# Patient Record
Sex: Male | Born: 1945 | Race: Black or African American | Hispanic: No | Marital: Single | Smoking: Never smoker
Health system: Southern US, Community
[De-identification: ages and names within clinical notes are randomized; demographics above are authoritative.]

## PROBLEM LIST (undated history)

## (undated) DIAGNOSIS — M1990 Unspecified osteoarthritis, unspecified site: Secondary | ICD-10-CM

## (undated) DIAGNOSIS — F0390 Unspecified dementia without behavioral disturbance: Secondary | ICD-10-CM

## (undated) DIAGNOSIS — G40909 Epilepsy, unspecified, not intractable, without status epilepticus: Secondary | ICD-10-CM

## (undated) DIAGNOSIS — R279 Unspecified lack of coordination: Secondary | ICD-10-CM

## (undated) DIAGNOSIS — I10 Essential (primary) hypertension: Secondary | ICD-10-CM

## (undated) DIAGNOSIS — M6281 Muscle weakness (generalized): Secondary | ICD-10-CM

## (undated) DIAGNOSIS — D696 Thrombocytopenia, unspecified: Secondary | ICD-10-CM

## (undated) DIAGNOSIS — G20 Parkinson's disease: Secondary | ICD-10-CM

## (undated) DIAGNOSIS — G20A1 Parkinson's disease without dyskinesia, without mention of fluctuations: Secondary | ICD-10-CM

## (undated) DIAGNOSIS — R471 Dysarthria and anarthria: Secondary | ICD-10-CM

## (undated) DIAGNOSIS — E222 Syndrome of inappropriate secretion of antidiuretic hormone: Secondary | ICD-10-CM

## (undated) DIAGNOSIS — I739 Peripheral vascular disease, unspecified: Secondary | ICD-10-CM

## (undated) DIAGNOSIS — F79 Unspecified intellectual disabilities: Secondary | ICD-10-CM

---

## 2015-09-05 ENCOUNTER — Inpatient Hospital Stay (HOSPITAL_COMMUNITY)
Admission: EM | Admit: 2015-09-05 | Discharge: 2015-09-07 | DRG: 534 | Disposition: A | Discharge status: Skilled Nursing Facility | Payer: Medicare Other | Attending: Internal Medicine | Admitting: Internal Medicine

## 2015-09-05 ENCOUNTER — Emergency Department (HOSPITAL_COMMUNITY): Payer: Medicare Other

## 2015-09-05 ENCOUNTER — Encounter (HOSPITAL_COMMUNITY): Payer: Self-pay

## 2015-09-05 ENCOUNTER — Inpatient Hospital Stay (HOSPITAL_COMMUNITY): Payer: Medicare Other

## 2015-09-05 DIAGNOSIS — M25561 Pain in right knee: Secondary | ICD-10-CM

## 2015-09-05 DIAGNOSIS — I739 Peripheral vascular disease, unspecified: Secondary | ICD-10-CM | POA: Diagnosis present

## 2015-09-05 DIAGNOSIS — Z7982 Long term (current) use of aspirin: Secondary | ICD-10-CM | POA: Diagnosis not present

## 2015-09-05 DIAGNOSIS — G40909 Epilepsy, unspecified, not intractable, without status epilepticus: Secondary | ICD-10-CM

## 2015-09-05 DIAGNOSIS — S7290XA Unspecified fracture of unspecified femur, initial encounter for closed fracture: Secondary | ICD-10-CM | POA: Insufficient documentation

## 2015-09-05 DIAGNOSIS — Z79899 Other long term (current) drug therapy: Secondary | ICD-10-CM | POA: Diagnosis not present

## 2015-09-05 DIAGNOSIS — S72401A Unspecified fracture of lower end of right femur, initial encounter for closed fracture: Principal | ICD-10-CM | POA: Diagnosis present

## 2015-09-05 DIAGNOSIS — I10 Essential (primary) hypertension: Secondary | ICD-10-CM | POA: Diagnosis present

## 2015-09-05 DIAGNOSIS — Y92009 Unspecified place in unspecified non-institutional (private) residence as the place of occurrence of the external cause: Secondary | ICD-10-CM

## 2015-09-05 DIAGNOSIS — W19XXXA Unspecified fall, initial encounter: Secondary | ICD-10-CM

## 2015-09-05 DIAGNOSIS — Z993 Dependence on wheelchair: Secondary | ICD-10-CM

## 2015-09-05 DIAGNOSIS — M25461 Effusion, right knee: Secondary | ICD-10-CM

## 2015-09-05 DIAGNOSIS — K5900 Constipation, unspecified: Secondary | ICD-10-CM | POA: Diagnosis present

## 2015-09-05 DIAGNOSIS — G20 Parkinson's disease: Secondary | ICD-10-CM | POA: Diagnosis present

## 2015-09-05 DIAGNOSIS — Y92129 Unspecified place in nursing home as the place of occurrence of the external cause: Secondary | ICD-10-CM | POA: Diagnosis not present

## 2015-09-05 DIAGNOSIS — S72409A Unspecified fracture of lower end of unspecified femur, initial encounter for closed fracture: Secondary | ICD-10-CM | POA: Diagnosis present

## 2015-09-05 DIAGNOSIS — M1990 Unspecified osteoarthritis, unspecified site: Secondary | ICD-10-CM | POA: Diagnosis present

## 2015-09-05 DIAGNOSIS — F0390 Unspecified dementia without behavioral disturbance: Secondary | ICD-10-CM | POA: Diagnosis present

## 2015-09-05 DIAGNOSIS — G20A1 Parkinson's disease without dyskinesia, without mention of fluctuations: Secondary | ICD-10-CM | POA: Diagnosis present

## 2015-09-05 DIAGNOSIS — F79 Unspecified intellectual disabilities: Secondary | ICD-10-CM

## 2015-09-05 DIAGNOSIS — Z01810 Encounter for preprocedural cardiovascular examination: Secondary | ICD-10-CM

## 2015-09-05 DIAGNOSIS — W050XXA Fall from non-moving wheelchair, initial encounter: Secondary | ICD-10-CM | POA: Diagnosis present

## 2015-09-05 DIAGNOSIS — S7291XA Unspecified fracture of right femur, initial encounter for closed fracture: Secondary | ICD-10-CM

## 2015-09-05 HISTORY — DX: Dysarthria and anarthria: R47.1

## 2015-09-05 HISTORY — DX: Syndrome of inappropriate secretion of antidiuretic hormone: E22.2

## 2015-09-05 HISTORY — DX: Unspecified intellectual disabilities: F79

## 2015-09-05 HISTORY — DX: Muscle weakness (generalized): M62.81

## 2015-09-05 HISTORY — DX: Thrombocytopenia, unspecified: D69.6

## 2015-09-05 HISTORY — DX: Unspecified dementia, unspecified severity, without behavioral disturbance, psychotic disturbance, mood disturbance, and anxiety: F03.90

## 2015-09-05 HISTORY — DX: Epilepsy, unspecified, not intractable, without status epilepticus: G40.909

## 2015-09-05 HISTORY — DX: Parkinson's disease: G20

## 2015-09-05 HISTORY — DX: Unspecified lack of coordination: R27.9

## 2015-09-05 HISTORY — DX: Parkinson's disease without dyskinesia, without mention of fluctuations: G20.A1

## 2015-09-05 HISTORY — DX: Unspecified osteoarthritis, unspecified site: M19.90

## 2015-09-05 HISTORY — DX: Essential (primary) hypertension: I10

## 2015-09-05 HISTORY — DX: Peripheral vascular disease, unspecified: I73.9

## 2015-09-05 LAB — URINALYSIS, ROUTINE W REFLEX MICROSCOPIC
BILIRUBIN URINE: NEGATIVE
GLUCOSE, UA: NEGATIVE mg/dL
HGB URINE DIPSTICK: NEGATIVE
KETONES UR: NEGATIVE mg/dL
Leukocytes, UA: NEGATIVE
Nitrite: NEGATIVE
PH: 7 (ref 5.0–8.0)
Protein, ur: NEGATIVE mg/dL
SPECIFIC GRAVITY, URINE: 1.026 (ref 1.005–1.030)
Urobilinogen, UA: 1 mg/dL (ref 0.0–1.0)

## 2015-09-05 LAB — BASIC METABOLIC PANEL
ANION GAP: 6 (ref 5–15)
BUN: 12 mg/dL (ref 6–20)
CALCIUM: 9 mg/dL (ref 8.9–10.3)
CO2: 28 mmol/L (ref 22–32)
CREATININE: 0.91 mg/dL (ref 0.61–1.24)
Chloride: 105 mmol/L (ref 101–111)
GFR calc Af Amer: >60 mL/min (ref >60)
GLUCOSE: 105 mg/dL — AB (ref 65–99)
Potassium: 4.7 mmol/L (ref 3.5–5.1)
Sodium: 139 mmol/L (ref 135–145)

## 2015-09-05 LAB — CBC WITH DIFFERENTIAL/PLATELET
BASOS ABS: 0 10*3/uL (ref 0.0–0.1)
BASOS PCT: 0 %
EOS PCT: 4 %
Eosinophils Absolute: 0.3 10*3/uL (ref 0.0–0.7)
HEMATOCRIT: 36.5 % — AB (ref 39.0–52.0)
Hemoglobin: 11.9 g/dL — ABNORMAL LOW (ref 13.0–17.0)
LYMPHS PCT: 20 %
Lymphs Abs: 1.7 10*3/uL (ref 0.7–4.0)
MCH: 29.4 pg (ref 26.0–34.0)
MCHC: 32.6 g/dL (ref 30.0–36.0)
MCV: 90.1 fL (ref 78.0–100.0)
MONO ABS: 1.3 10*3/uL — AB (ref 0.1–1.0)
MONOS PCT: 15 %
NEUTROS ABS: 5.3 10*3/uL (ref 1.7–7.7)
Neutrophils Relative %: 61 %
PLATELETS: 154 10*3/uL (ref 150–400)
RBC: 4.05 MIL/uL — ABNORMAL LOW (ref 4.22–5.81)
RDW: 14.2 % (ref 11.5–15.5)
WBC: 8.7 10*3/uL (ref 4.0–10.5)

## 2015-09-05 LAB — PROTIME-INR
INR: 1.24 (ref 0.00–1.49)
Prothrombin Time: 15.8 seconds — ABNORMAL HIGH (ref 11.6–15.2)

## 2015-09-05 MED ORDER — ACETAMINOPHEN 325 MG PO TABS: 650.0000 mg | ORAL_TABLET | Freq: Four times a day (QID) | ORAL | Status: DC | PRN

## 2015-09-05 MED ORDER — POLYETHYLENE GLYCOL 3350 17 G PO PACK
17.0000 g | PACK | ORAL | Status: DC
  Administered 2015-09-06: 17 g via ORAL
  Filled 2015-09-05: qty 1

## 2015-09-05 MED ORDER — ENOXAPARIN SODIUM 40 MG/0.4ML ~~LOC~~ SOLN
40.0000 mg | Freq: Every day | SUBCUTANEOUS | Status: DC
Start: 2015-09-06 — End: 2015-09-07
  Administered 2015-09-06 (×2): 40 mg via SUBCUTANEOUS
  Filled 2015-09-05 (×2): qty 0.4

## 2015-09-05 MED ORDER — HYDROCODONE-ACETAMINOPHEN 5-325 MG PO TABS: 1.0000 | ORAL_TABLET | Freq: Four times a day (QID) | ORAL | Status: DC | PRN

## 2015-09-05 MED ORDER — METHOCARBAMOL 500 MG PO TABS: 500.0000 mg | ORAL_TABLET | Freq: Four times a day (QID) | ORAL | Status: DC | PRN

## 2015-09-05 MED ORDER — ONDANSETRON HCL 4 MG/2ML IJ SOLN: 4.0000 mg | Freq: Four times a day (QID) | INTRAMUSCULAR | Status: DC | PRN

## 2015-09-05 MED ORDER — MORPHINE SULFATE (PF) 2 MG/ML IV SOLN: 2.0000 mg | INTRAVENOUS | Status: DC | PRN

## 2015-09-05 MED ORDER — BISACODYL 5 MG PO TBEC
10.0000 mg | DELAYED_RELEASE_TABLET | Freq: Every day | ORAL | Status: DC
  Administered 2015-09-06: 10 mg via ORAL
  Filled 2015-09-05: qty 2

## 2015-09-05 MED ORDER — LEVETIRACETAM 250 MG PO TABS: 250.0000 mg | ORAL_TABLET | Freq: Two times a day (BID) | ORAL | Status: DC

## 2015-09-05 MED ORDER — METHOCARBAMOL 1000 MG/10ML IJ SOLN
500.0000 mg | Freq: Four times a day (QID) | INTRAVENOUS | Status: DC | PRN
  Filled 2015-09-05: qty 5

## 2015-09-05 MED ORDER — LEVETIRACETAM 500 MG PO TABS: 1000.0000 mg | ORAL_TABLET | Freq: Two times a day (BID) | ORAL | Status: DC

## 2015-09-05 MED ORDER — CHLORHEXIDINE GLUCONATE 0.12 % MT SOLN
5.0000 mL | Freq: Two times a day (BID) | OROMUCOSAL | Status: DC
  Administered 2015-09-06 – 2015-09-07 (×2): 10 mL via OROMUCOSAL
  Filled 2015-09-05 (×3): qty 15

## 2015-09-05 MED ORDER — LEVETIRACETAM 750 MG PO TABS
1250.0000 mg | ORAL_TABLET | Freq: Two times a day (BID) | ORAL | Status: DC
  Administered 2015-09-06 – 2015-09-07 (×4): 1250 mg via ORAL
  Filled 2015-09-05 (×5): qty 1

## 2015-09-05 MED ORDER — ASPIRIN EC 81 MG PO TBEC
81.0000 mg | DELAYED_RELEASE_TABLET | Freq: Every day | ORAL | Status: DC
  Administered 2015-09-06 – 2015-09-07 (×2): 81 mg via ORAL
  Filled 2015-09-05 (×2): qty 1

## 2015-09-05 NOTE — H&P (Signed)
[facility] History and Physical  Patient: Jason Burgess  MRN: [id number]  DOB: 1946-01-01  DOS: the patient was seen and examined on 09/05/2015 PCP: No primary care provider on file.  Referring physician: Dr. Clarice PolePfeifer Chief Complaint: Fall  HPI: Jason Burgess is a 69 y.o. male with Past medical history of seizures, Parkinson's disease, dementia, chronic ureter status, intellectual disability . The patient is presenting with complaints of a fall. [facility]. Patient is chronic wheelchair-bound. Today the wheelchair was not locked and why the patient was trying to stand up he suddenly lost his balance and fell on the ground. Patient denies any passing out episode and recurrent fall any chest pain abdominal pain nausea vomiting any headache any neck pain any focal deficit. Patient generally has some constipation. Patient denies any burning urination. Patient generally helps in transferring him from wheelchair to bed.  The patient is coming from SNF.  At his baseline ambulates with a wheelchair And is dependent for most of his ADL; does not manages his medication on his own.  Review of Systems: as mentioned in the history of present illness.  A comprehensive review of the other systems is negative.  Past Medical History  Diagnosis Date  . Epilepsy (HCC)   . SIADH (syndrome of inappropriate ADH production) (HCC)   . Parkinson disease (HCC)   . Arthritis   . Dementia without behavioral disturbance   . Dysarthria and anarthria   . Thrombocytopenia (HCC)   . Muscle weakness (generalized)   . Unspecified intellectual disabilities   . Hypertension   . PVD (peripheral vascular disease) (HCC)   . Lack of coordination    History reviewed. No pertinent past surgical history. Social History:  reports that he has never smoked. He does not have any smokeless tobacco history on file. He reports that he does not drink alcohol or use illicit  drugs.  No Known Allergies  History reviewed. No pertinent family history.  Prior to Admission medications   Medication Sig Start Date End Date Taking? Authorizing Provider  acetaminophen (TYLENOL) 325 MG tablet Take 650 mg by mouth every 4 (four) hours as needed for fever (pain).   Yes Historical Provider, MD  aspirin EC 81 MG tablet Take 81 mg by mouth daily.   Yes Historical Provider, MD  bisacodyl (DULCOLAX) 5 MG EC tablet Take 10 mg by mouth at bedtime.   Yes Historical Provider, MD  chlorhexidine (PERIDEX) 0.12 % solution Place 5-10 mLs onto teeth 2 (two) times daily. Dip toothbrush in 5-10 mls and liberally brush teeth twice daily   Yes Historical Provider, MD  furosemide (LASIX) 20 MG tablet Take 20 mg by mouth daily.   Yes Historical Provider, MD  levETIRAcetam (KEPPRA) 1000 MG tablet Take 1,000 mg by mouth 2 (two) times daily. Take with a 250 mg tablet for a 1250 mg dose.   Yes Historical Provider, MD  levETIRAcetam (KEPPRA) 250 MG tablet Take 250 mg by mouth 2 (two) times daily. Take with a 1000 mg tablet for a 1250 mg dose   Yes Historical Provider, MD  LORazepam (ATIVAN) 2 MG/ML injection Inject 1.5 mg into the muscle See admin instructions. Inject 0.75 ml (1.5 mg) intramuscularly every 24 hours as needed for seizure activity   Yes Historical Provider, MD  Multiple Vitamins-Minerals (CERTAVITE/ANTIOXIDANTS) TABS Take 1 tablet by mouth daily.   Yes Historical Provider, MD  OXYGEN Inhale into the lungs as needed (to maintain SATS >90%).  Yes Historical Provider, MD  polyethylene glycol (MIRALAX / GLYCOLAX) packet Take 17 g by mouth every other day. Mix in 4-8 oz of liquid and drink   Yes Historical Provider, MD  Vitamin D, Ergocalciferol, (DRISDOL) 50000 UNITS CAPS capsule Take 50,000 Units by mouth every 30 (thirty) days. On the 1st of each month   Yes Historical Provider, MD    Physical Exam: Filed Vitals:   09/05/15 2010 09/05/15 2045 09/05/15 2058 09/05/15 2230  BP: 158/85  150/66  161/75  Pulse: 59 59 59 111  Temp: 98.5 F (36.9 C)     TempSrc: Oral     Resp: SpO2: 100% 100% 100% 100%    General: Alert, Awake and Oriented to Time, Place and Person. Appear in mild distress Eyes: PERRL ENT: Oral Mucosa clear moist. Neck: no JVD Cardiovascular: S1 and S2 Present, no Murmur, Peripheral Pulses Present Respiratory: Bilateral Air entry equal and Decreased,  Clear to Auscultation, no Crackles, no wheezes Abdomen: Bowel Sound present, Soft and no tenderness Skin: no Rash Extremities: no Pedal edema, no calf tenderness Tenderness at the right knee joint Neurologic: Grossly no focal neuro deficit.  Labs on Admission:  CBC:  Recent Labs Lab 09/05/15 1927  WBC 8.7  NEUTROABS 5.3  HGB 11.9*  HCT 36.5*  MCV 90.1  PLT 154    CMP     Component Value Date/Time   NA 139 09/05/2015 1927   K 4.7 09/05/2015 1927   CL 105 09/05/2015 1927   CO2 28 09/05/2015 1927   GLUCOSE 105* 09/05/2015 1927   BUN 12 09/05/2015 1927   CREATININE 0.91 09/05/2015 1927   CALCIUM 9.0 09/05/2015 1927   GFRNONAA >60 09/05/2015 1927   GFRAA >60 09/05/2015 1927    No results for input(s): CKTOTAL, CKMB, CKMBINDEX, TROPONINI in the last 168 hours. BNP (last 3 results) No results for input(s): BNP in the last 8760 hours.  ProBNP (last 3 results) No results for input(s): PROBNP in the last 8760 hours.   Radiological Exams on Admission: Dg Knee 1-2 Views Right  09/05/2015  CLINICAL DATA:  Fall.  Right knee pain. EXAM: RIGHT KNEE - 1-2 VIEW COMPARISON:  None. FINDINGS: There is a comminuted fracture of the distal metaphysis of the right femur, likely non articular, with 14 mm overriding of the fracture fragments and a 1 cm medial displacement of the dominant distal fracture fragment. There is prominent soft tissue swelling surrounding the fracture site. No malalignment is seen at the right knee joint. No definite right knee joint effusion. No suspicious focal  osseous lesion in the right knee. Diffuse osteopenia. IMPRESSION: Comminuted, overriding, displaced fracture of the distal metaphysis of the right femur, likely non articular. Diffuse osteopenia. Electronically Signed   By: Delbert Phenix M.D.   On: 09/05/2015 19:14   Ct Knee Right Wo Contrast  09/05/2015  CLINICAL DATA:  Evaluate femur fractures.  Fell yesterday. EXAM: CT OF THE right KNEE WITHOUT CONTRAST TECHNIQUE: Multidetector CT imaging of the right knee was performed according to the standard protocol. Multiplanar CT image reconstructions were also generated. COMPARISON:  Radiographs 09/05/2015 FINDINGS: Nondisplaced comminuted fracture of the distal femoral shaft extending down into the supracondylar areas bilaterally, slightly lower on the lateral side than the medial side. No involvement of the intertrochanteric notch or articular surface. Moderate to advanced osteoporosis is noted. The tibia, fibula and patella are intact. No joint effusion. On the sagittal reformatted images the ACL PCL appear intact. Mild  tricompartmental degenerative changes are noted. The quadriceps and patellar tendons are intact. IMPRESSION: Relatively nondisplaced comminuted distal femoral/supracondylar femur fracture. No intra-articular involvement. Electronically Signed   By: Rudie Meyer M.D.   On: 09/05/2015 21:03   Chest Portable 1 View  09/05/2015  CLINICAL DATA:  Femur fracture yesterday. Preoperative evaluation. History of dementia, hypertension, thrombocytopenia. EXAM: PORTABLE CHEST 1 VIEW COMPARISON:  Chest radiograph August 02, 2011 FINDINGS: Cardiomediastinal silhouette is unremarkable for this low inspiratory portable examination with crowded vasculature markings. The lungs are clear without pleural effusions or focal consolidations. Trachea projects midline and there is no pneumothorax. Included soft tissue planes and osseous structures are non-suspicious. IMPRESSION: No acute cardiopulmonary process in this low  inspiratory portable examination. Electronically Signed   By: Awilda Metro M.D.   On: 09/05/2015 22:47   Dg Femur, Min 2 Views Right  09/05/2015  CLINICAL DATA:  Patient fell yesterday. Complaining of right knee pain. EXAM: RIGHT FEMUR 2 VIEWS COMPARISON:  None. FINDINGS: There is an oblique fracture of the distal femur. Fracture extends from the medial meta diaphysis to the lateral metaphysis. There is a secondary butterfly fracture component along the medial aspect. There is no fracture dislocation or angulation. No other fractures.  Knee and hip joints are normally aligned. Bones are diffusely demineralized. IMPRESSION: Nondisplaced fracture of the distal right femur as described. Electronically Signed   By: Amie Portland M.D.   On: 09/05/2015 19:12   EKG: Independently reviewed. normal sinus rhythm, nonspecific ST and T waves changes.  Assessment/Plan 1. Fracture of distal femur [facility]) Patient is presenting with a mechanical fall. X-ray of the knee shows distal femur fracture. Dr. Roda Shutters from orthopedics recommends the patient to be admitted to medicine service due to his comorbidities. The patient will be admitted by them in the morning and then they will decide further workup for the patient. Recommend to keep the patient nothing by mouth after midnight.  2.A) Cardiac risk: Based on RCRI   With this the patient is a moderate risk for adverse Cardiac outcome from surgery due to his age. EKG is unremarkable for any acute ischemia, currently Recommend further no work up.  B) Pulmonary risk: Good pulmunary toilet.  3  Epilepsy (HCC) Continuing home Keppra.  4 Dementia without behavioral disturbance Currently the patient is cooperative and not having any DR disturbances. Monitor closely for delirium.  5  Hypertension Holding diuretic.  Nutrition: Regular diet nothing by mouth after midnight DVT Prophylaxis: subcutaneous Heparin  Advance goals of care discussion: Full code    Consults: Orthopedic Dr. Roda Shutters  Disposition: Admitted as inpatient, med-surge unit.  Author: Lynden Oxford, MD [facility] Pager: [phone] 09/05/2015  If 7PM-7AM, please contact night-coverage [email or web address] Password [email or web address]

## 2015-09-05 NOTE — ED Provider Notes (Signed)
CSN: [id number]     Arrival date & time 09/05/15  1735 History   First MD Initiated Contact with Patient 09/05/15 1807     Chief Complaint  Patient presents with  . Fall  . Leg Injury     (Consider location/radiation/quality/duration/timing/severity/associated sxs/prior Treatment) HPI Comments: Florestine AversWilliam Cabral is a 69 y.o. male with a PMHx of parkinson's disease, SIADH, epilepsy, dementia, arthritis, chronic thrombocytopenia, chronic weakness, intellectual disabilities, PVD, and HTN, who presents to the ED via EMS from [facility] with complaints of right knee pain. LEVEL 5 CAVEAT DUE TO DEMENTIA/DEVELOPMENTAL DELAYS. Paperwork sent with patient states he is being sent for a distal femur fracture after a fall on 11/4, patient is unable to tell me any details about the fall other than that occurred yesterday while he was transferring himself. His paperwork has an x-ray report that states the distal femur fracture has mild medial displacement. Patient reports his knee pain is intermittent sore nonradiating pain worse with movement and improved with Tylenol, but he is unable to quantify his pain. Associated symptoms include mild swelling of the knee. He denies any fevers, chills, chest pain, shortness breath, cough, abdominal pain, nausea, vomiting, diarrhea, constipation, dysuria, hematuria, numbness, tingling, or weakness.  PCP listed on his paperwork is Dr. Colon BranchAyyaz Qureshi  Nursing home states: self-transferring and he fell, nurse that was on isn't there today, per report in his chart it states "observed sitting on floor next to bed, wheelchair brakes were noted to be unlocked"; unwitnessed. They report he's at his baseline. No other complaints. Report he is in fact a full code.  Patient is a 69 y.o. male presenting with fall and knee pain. The history is provided by the patient, the EMS personnel and the nursing home. No language interpreter was used.  Fall Associated symptoms  include arthralgias (R knee pain) and joint swelling. Pertinent negatives include no abdominal pain, chest pain, chills, coughing, fever, nausea, numbness, vomiting or weakness.  Knee Pain Location:  Knee Time since incident:  1 day Injury: yes   Mechanism of injury: fall   Knee location:  R knee Pain details:    Quality: sore.   Radiates to:  Does not radiate   Severity:  Unable to specify   Onset quality:  Sudden   Duration:  1 day   Timing:  Intermittent   Progression:  Unchanged Chronicity:  New Tetanus status:  Unknown Prior injury to area:  Unable to specify Relieved by:  Acetaminophen Worsened by:  Activity Ineffective treatments:  None tried Associated symptoms: decreased ROM and swelling   Associated symptoms: no back pain, no fever, no muscle weakness, no numbness and no tingling     Past Medical History  Diagnosis Date  . Epilepsy (HCC)   . SIADH (syndrome of inappropriate ADH production) (HCC)   . Parkinson disease (HCC)   . Arthritis   . Dementia without behavioral disturbance   . Dysarthria and anarthria   . Thrombocytopenia (HCC)   . Muscle weakness (generalized)   . Unspecified intellectual disabilities   . Hypertension   . PVD (peripheral vascular disease) (HCC)   . Lack of coordination    History reviewed. No pertinent past surgical history. History reviewed. No pertinent family history. Social History  Substance Use Topics  . Smoking status: Never Smoker   . Smokeless tobacco: None  . Alcohol Use: No    LEVEL 5 CAVEAT DUE TO DEMENTIA/DEVELOPMENTAL DELAYS  Review of Systems  Unable to perform  ROS: Dementia  Constitutional: Negative for fever and chills.  Respiratory: Negative for cough and shortness of breath.   Cardiovascular: Negative for chest pain.  Gastrointestinal: Negative for nausea, vomiting, abdominal pain, diarrhea and constipation.  Genitourinary: Negative for dysuria and hematuria.  Musculoskeletal: Positive for joint swelling  and arthralgias (R knee pain). Negative for back pain.  Skin: Negative for color change.  Neurological: Negative for weakness and numbness.      Allergies  Review of patient's allergies indicates no known allergies.  Home Medications   Prior to Admission medications   Not on File   BP 126/36 mmHg  Pulse 64  Temp(Src) 98.6 F (37 C) (Oral)  Resp 16  SpO2 98% Physical Exam  Constitutional: He is oriented to person, place, and time. Vital signs are normal. He appears well-developed and well-nourished.  Non-toxic appearance. No distress.  Afebrile, nontoxic, NAD  HENT:  Head: Normocephalic and atraumatic.  Mouth/Throat: Oropharynx is clear and moist and mucous membranes are normal.  Eyes: Conjunctivae and EOM are normal. Right eye exhibits no discharge. Left eye exhibits no discharge.  Neck: Normal range of motion. Neck supple. No spinous process tenderness and no muscular tenderness present. No rigidity. Normal range of motion present.  FROM intact without spinous process TTP, no bony stepoffs or deformities, no paraspinous muscle TTP or muscle spasms. No rigidity or meningeal signs. No bruising or swelling.  Cardiovascular: Normal rate, regular rhythm, normal heart sounds and intact distal pulses.  Exam reveals no gallop and no friction rub.   No murmur heard. Pulmonary/Chest: Effort normal and breath sounds normal. No respiratory distress. He has no decreased breath sounds. He has no wheezes. He has no rhonchi. He has no rales.  Abdominal: Soft. Normal appearance and bowel sounds are normal. He exhibits no distension. There is no tenderness. There is no rigidity, no rebound, no guarding and no CVA tenderness.  Musculoskeletal:       Right hip: Normal.       Right knee: He exhibits decreased range of motion, swelling and bony tenderness. He exhibits no erythema and normal patellar mobility. Tenderness found. Medial joint line tenderness noted.       Cervical back: Normal.        Thoracic back: Normal.       Lumbar back: Normal.       Legs: All spinal levels nonTTP without step offs or deformities, R hip with full passive ROM intact, no focal joint line tenderness. R knee with limited ROM due to pain, with medial joint line and distal femur TTP, +swelling/effusion, no gross deformity, no bruising/erythema/warmth, no abnormal patellar mobility. No tenderness to calf/lower leg.  Pt uncooperative with strength testing but able to wiggle all digits. Distal pulses intact and with sensation grossly intact.   Neurological: He is alert and oriented to person, place, and time. No sensory deficit.  A&O x3  Skin: Skin is warm, dry and intact. No rash noted.  Psychiatric: He has a normal mood and affect.  Nursing note and vitals reviewed.   ED Course  Procedures (including critical care time) Labs Review Labs Reviewed  CBC WITH DIFFERENTIAL/PLATELET - Abnormal; Notable for the following:    RBC 4.05 (*)    Hemoglobin 11.9 (*)    HCT 36.5 (*)    Monocytes Absolute 1.3 (*)    All other components within normal limits  BASIC METABOLIC PANEL  URINALYSIS, ROUTINE W REFLEX MICROSCOPIC (NOT AT [facility])    Imaging Review Dg Knee 1-2  Views Right  09/05/2015  CLINICAL DATA:  Fall.  Right knee pain. EXAM: RIGHT KNEE - 1-2 VIEW COMPARISON:  None. FINDINGS: There is a comminuted fracture of the distal metaphysis of the right femur, likely non articular, with 14 mm overriding of the fracture fragments and a 1 cm medial displacement of the dominant distal fracture fragment. There is prominent soft tissue swelling surrounding the fracture site. No malalignment is seen at the right knee joint. No definite right knee joint effusion. No suspicious focal osseous lesion in the right knee. Diffuse osteopenia. IMPRESSION: Comminuted, overriding, displaced fracture of the distal metaphysis of the right femur, likely non articular. Diffuse osteopenia. Electronically Signed   By: Delbert Phenix M.D.   On:  09/05/2015 19:14   Dg Femur, Min 2 Views Right  09/05/2015  CLINICAL DATA:  Patient fell yesterday. Complaining of right knee pain. EXAM: RIGHT FEMUR 2 VIEWS COMPARISON:  None. FINDINGS: There is an oblique fracture of the distal femur. Fracture extends from the medial meta diaphysis to the lateral metaphysis. There is a secondary butterfly fracture component along the medial aspect. There is no fracture dislocation or angulation. No other fractures.  Knee and hip joints are normally aligned. Bones are diffusely demineralized. IMPRESSION: Nondisplaced fracture of the distal right femur as described. Electronically Signed   By: Amie Portland M.D.   On: 09/05/2015 19:12   I have personally reviewed and evaluated these images and lab results as part of my medical decision-making.   EKG Interpretation   Date/Time:  Saturday September 05 2015 19:22:57 EDT Ventricular Rate:  61 PR Interval:  146 QRS Duration: 86 QT Interval:  419 QTC Calculation: 422 R Axis:   48 Text Interpretation:  Sinus rhythm no STEMI. borderline ST abnormality  inferior. no old comparson. Confirmed by Donnald Garre, MD, Lebron Conners [phone]) on  09/05/2015 7:31:06 PM      MDM   Final diagnoses:  Closed fracture of right femur, unspecified fracture morphology, initial encounter (HCC)  Right knee pain  Knee swelling, right  Fall, initial encounter    69 y.o. male with dementia and intellectual disability sent here from [facility] for femur fx after fall yesterday during transferring. Pt unable to provide any history. Unknown regarding why pt fell, unable to get ahold of nursing home staff initially. He comes with papers that have an Xray report of R knee dated 09/05/15 that shows "acute distal femur fx with mild medial displacement with soft tissue swelling". Unable to get images in our system. Will get basic labs and U/A to eval for possible causes of fall, and get xrays of knee/femur here in order to evaluate fx. Pt  declines pain meds at this time. Pt's paperwork states he is a full code. Will reassess shortly. Will continue to attempt to reach nursing home facility.  6:42 PM Able to contact nursing home, nursing staff state that reports from his chart state the wheelchair he was transferring into was unlocked. He was found next to his bed after the fall. Unwitnessed, but he's been at baseline since then, no complaints aside from knee pain. Confirm that he is a full code.   7:27 PM Xray resulting, shows comminuted displaced fx of distal R femur (on knee reading; interestingly the femur reading states it's nondisplaced). Labs and U/A still not back, but will consult ortho to discuss if this is surgical vs nonoperative.  7:44 PM Dr. Roda Shutters returning page, states he would like to place pt in knee immobilizer,  have CT knee performed, and NPO after midnight for operative repair tomorrow. Wants medical admission. CBC w/diff unremarkable, still awaiting BMP and U/A. Will place these orders and await labs before calling for admission.  7:59 PM BMP and U/A still pending. Will sign out care to Arby Barrette MD at shift change. Please see her notes for results and ultimate admission.   BP 126/36 mmHg  Pulse 64  Temp(Src) 98.6 F (37 C) (Oral)  Resp 16  SpO2 98%   Rakiya Krawczyk Camprubi-Soms, PA-C 09/05/15 2000  Arby Barrette, MD 09/08/15 2349

## 2015-09-05 NOTE — ED Notes (Signed)
In and Out cath performed by Kenney Housemananya - EMT and Gavin Poundeborah - EMT assisted

## 2015-09-05 NOTE — ED Notes (Signed)
To room via EMS from [facility].  Pt fell yesterday transferring self, not known how.  No injuries or complaints yesterday but today staff noticed swelling in right knee.  Xray results show acute fracture seen involving the distal femur.  There is mild medial displacement of the distal femur, soft tissues swollen.  Pt has no c/o pain.

## 2015-09-06 ENCOUNTER — Encounter (HOSPITAL_COMMUNITY)
Admission: EM | Disposition: A | Discharge status: Skilled Nursing Facility | Payer: Self-pay | Source: Home / Self Care | Attending: Internal Medicine

## 2015-09-06 DIAGNOSIS — S72401A Unspecified fracture of lower end of right femur, initial encounter for closed fracture: Principal | ICD-10-CM

## 2015-09-06 DIAGNOSIS — F0390 Unspecified dementia without behavioral disturbance: Secondary | ICD-10-CM

## 2015-09-06 DIAGNOSIS — Z993 Dependence on wheelchair: Secondary | ICD-10-CM

## 2015-09-06 DIAGNOSIS — G40909 Epilepsy, unspecified, not intractable, without status epilepticus: Secondary | ICD-10-CM

## 2015-09-06 LAB — ABO/RH: ABO/RH(D): A POS

## 2015-09-06 LAB — TYPE AND SCREEN
ABO/RH(D): A POS
ANTIBODY SCREEN: NEGATIVE

## 2015-09-06 SURGERY — INSERTION, INTRAMEDULLARY ROD, FEMUR, RETROGRADE
Anesthesia: General | Laterality: Right

## 2015-09-06 NOTE — Progress Notes (Signed)
PROGRESS NOTE  Nephi Savage WUJ:[id number] DOB: 1945-12-28 DOA: 09/05/2015 PCP: No primary care provider on file.  Assessment/Plan: Fracture of distal femur (HCC) - mechanical fall. -non-operative per Dr. Roda Shutters: NWB, brace -patient from [facility]  Epilepsy [facility]) Keppra.  Dementia without behavioral disturbance Currently the patient is cooperative and not having any DR disturbances. Monitor closely for delirium.  Hypertension stable   Code Status: full Family Communication: patient/call brother [name] Disposition Plan:    Consultants:  Roda Shutters- ortho  Procedures:      HPI/Subjective: No SOB, no CP  Objective: Filed Vitals:   09/06/15 0530  BP: 142/45  Pulse: 67  Temp: 98.9 F (37.2 C)  Resp: 16    Intake/Output Summary (Last 24 hours) at 09/06/15 0809 Last data filed at 09/05/15 2006  Gross per 24 hour  Intake      0 ml  Output    175 ml  Net   -175 ml   Filed Weights   09/06/15 0000  Weight: 68.856 kg (151 lb 12.8 oz)    Exam:   General:  Pleasant/cooperative  Cardiovascular: rrr  Respiratory: clear  Abdomen: +BS, soft  Musculoskeletal: no edema   Data Reviewed: Basic Metabolic Panel:  Recent Labs Lab 09/05/15 1927  NA 139  K 4.7  CL 105  CO2 28  GLUCOSE 105*  BUN 12  CREATININE 0.91  CALCIUM 9.0   Liver Function Tests: No results for input(s): AST, ALT, ALKPHOS, BILITOT, PROT, ALBUMIN in the last 168 hours. No results for input(s): LIPASE, AMYLASE in the last 168 hours. No results for input(s): AMMONIA in the last 168 hours. CBC:  Recent Labs Lab 09/05/15 1927  WBC 8.7  NEUTROABS 5.3  HGB 11.9*  HCT 36.5*  MCV 90.1  PLT 154   Cardiac Enzymes: No results for input(s): CKTOTAL, CKMB, CKMBINDEX, TROPONINI in the last 168 hours. BNP (last 3 results) No results for input(s): BNP in the last 8760 hours.  ProBNP (last 3 results) No results for input(s): PROBNP in the last 8760 hours.  CBG: No results for  input(s): GLUCAP in the last 168 hours.  No results found for this or any previous visit (from the past 240 hour(s)).   Studies: Dg Knee 1-2 Views Right  09/05/2015  CLINICAL DATA:  Fall.  Right knee pain. EXAM: RIGHT KNEE - 1-2 VIEW COMPARISON:  None. FINDINGS: There is a comminuted fracture of the distal metaphysis of the right femur, likely non articular, with 14 mm overriding of the fracture fragments and a 1 cm medial displacement of the dominant distal fracture fragment. There is prominent soft tissue swelling surrounding the fracture site. No malalignment is seen at the right knee joint. No definite right knee joint effusion. No suspicious focal osseous lesion in the right knee. Diffuse osteopenia. IMPRESSION: Comminuted, overriding, displaced fracture of the distal metaphysis of the right femur, likely non articular. Diffuse osteopenia. Electronically Signed   By: Delbert Phenix M.D.   On: 09/05/2015 19:14   Ct Knee Right Wo Contrast  09/05/2015  CLINICAL DATA:  Evaluate femur fractures.  Fell yesterday. EXAM: CT OF THE right KNEE WITHOUT CONTRAST TECHNIQUE: Multidetector CT imaging of the right knee was performed according to the standard protocol. Multiplanar CT image reconstructions were also generated. COMPARISON:  Radiographs 09/05/2015 FINDINGS: Nondisplaced comminuted fracture of the distal femoral shaft extending down into the supracondylar areas bilaterally, slightly lower on the lateral side than the medial side. No involvement of the intertrochanteric notch or articular surface. Moderate  to advanced osteoporosis is noted. The tibia, fibula and patella are intact. No joint effusion. On the sagittal reformatted images the ACL PCL appear intact. Mild tricompartmental degenerative changes are noted. The quadriceps and patellar tendons are intact. IMPRESSION: Relatively nondisplaced comminuted distal femoral/supracondylar femur fracture. No intra-articular involvement. Electronically Signed    By: Rudie MeyerP.  Gallerani M.D.   On: 09/05/2015 21:03   Chest Portable 1 View  09/05/2015  CLINICAL DATA:  Femur fracture yesterday. Preoperative evaluation. History of dementia, hypertension, thrombocytopenia. EXAM: PORTABLE CHEST 1 VIEW COMPARISON:  Chest radiograph August 02, 2011 FINDINGS: Cardiomediastinal silhouette is unremarkable for this low inspiratory portable examination with crowded vasculature markings. The lungs are clear without pleural effusions or focal consolidations. Trachea projects midline and there is no pneumothorax. Included soft tissue planes and osseous structures are non-suspicious. IMPRESSION: No acute cardiopulmonary process in this low inspiratory portable examination. Electronically Signed   By: Awilda Metroourtnay  Bloomer M.D.   On: 09/05/2015 22:47   Dg Femur, Min 2 Views Right  09/05/2015  CLINICAL DATA:  Patient fell yesterday. Complaining of right knee pain. EXAM: RIGHT FEMUR 2 VIEWS COMPARISON:  None. FINDINGS: There is an oblique fracture of the distal femur. Fracture extends from the medial meta diaphysis to the lateral metaphysis. There is a secondary butterfly fracture component along the medial aspect. There is no fracture dislocation or angulation. No other fractures.  Knee and hip joints are normally aligned. Bones are diffusely demineralized. IMPRESSION: Nondisplaced fracture of the distal right femur as described. Electronically Signed   By: Amie Portlandavid  Ormond M.D.   On: 09/05/2015 19:12    Scheduled Meds: . aspirin EC  81 mg Oral Daily  . bisacodyl  10 mg Oral QHS  . chlorhexidine  5-10 mL Mouth/Throat BID  . enoxaparin (LOVENOX) injection  40 mg Subcutaneous QHS  . levETIRAcetam  1,250 mg Oral BID  . polyethylene glycol  17 g Oral QODAY   Continuous Infusions:  Antibiotics Given (last 72 hours)    None      Principal Problem:   Fracture of distal femur (HCC) Active Problems:   Epilepsy (HCC)   Parkinson disease (HCC)   Dementia without behavioral disturbance    Unspecified intellectual disabilities   Hypertension   Wheelchair bound    Time spent: 25 min    Awais Cobarrubias  [facility] Pager [phone]. If 7PM-7AM, please contact night-coverage at [email or web address], password [email or web address] 09/06/2015, 8:09 AM  LOS: 1 day

## 2015-09-06 NOTE — Progress Notes (Signed)
Orthopedic Tech Progress Note Patient Details:  Jason AversWilliam Burgess 02/11/1946 [id number] Applied fiberglass long leg splints to medial and lateral aspects of RLE.  Fixed knee in flexed position of approximately 90 degrees as ordered.  Pulses, sensation, motion intact before and after splinting.  Capillary refill less than 2 seconds before and after splinting.  Pt. is unable to use OHF with trapeze due to upper extremity weakness. Ortho Devices Type of Ortho Device: Long leg splint Ortho Device/Splint Location: RLE Ortho Device/Splint Interventions: Application   Lesle ChrisGilliland, Asiah Befort L 09/06/2015, 12:02 AM

## 2015-09-06 NOTE — Consult Note (Signed)
ORTHOPAEDIC CONSULTATION  REQUESTING PHYSICIAN: Joseph Art, DO  Chief Complaint: right femur fracture  HPI: Jason Burgess is a 69 y.o. male who presents with left femur fracture s/p mechanical fall from wheelchair.  The patient is mentally challenged and has dementia and parkinsons and endorses severe pain in the right thigh, that does not radiate, grinding in quality, worse with any movement, better with immobilization.  Denies LOC/fever/chills/nausea/vomiting.  Does not walk and is in wheelchair at baseline and has been for many years.  Does not live indepedently.  Past Medical History  Diagnosis Date  . Epilepsy (HCC)   . SIADH (syndrome of inappropriate ADH production) (HCC)   . Parkinson disease (HCC)   . Arthritis   . Dementia without behavioral disturbance   . Dysarthria and anarthria   . Thrombocytopenia (HCC)   . Muscle weakness (generalized)   . Unspecified intellectual disabilities   . Hypertension   . PVD (peripheral vascular disease) (HCC)   . Lack of coordination    History reviewed. No pertinent past surgical history. Social History   Social History  . Marital Status: Single    Spouse Name: [name]  . Number of Children: [name]  . Years of Education: [name]   Social History Main Topics  . Smoking status: Never Smoker   . Smokeless tobacco: None  . Alcohol Use: No  . Drug Use: No  . Sexual Activity: Not Asked   Other Topics Concern  . None   Social History Narrative  . None   History reviewed. No pertinent family history. No Known Allergies Prior to Admission medications   Medication Sig Start Date End Date Taking? Authorizing Provider  acetaminophen (TYLENOL) 325 MG tablet Take 650 mg by mouth every 4 (four) hours as needed for fever (pain).   Yes Historical Provider, MD  aspirin EC 81 MG tablet Take 81 mg by mouth daily.   Yes Historical Provider, MD  bisacodyl (DULCOLAX) 5 MG EC tablet Take 10 mg by mouth at bedtime.   Yes Historical Provider, MD    chlorhexidine (PERIDEX) 0.12 % solution Place 5-10 mLs onto teeth 2 (two) times daily. Dip toothbrush in 5-10 mls and liberally brush teeth twice daily   Yes Historical Provider, MD  furosemide (LASIX) 20 MG tablet Take 20 mg by mouth daily.   Yes Historical Provider, MD  levETIRAcetam (KEPPRA) 1000 MG tablet Take 1,000 mg by mouth 2 (two) times daily. Take with a 250 mg tablet for a 1250 mg dose.   Yes Historical Provider, MD  levETIRAcetam (KEPPRA) 250 MG tablet Take 250 mg by mouth 2 (two) times daily. Take with a 1000 mg tablet for a 1250 mg dose   Yes Historical Provider, MD  LORazepam (ATIVAN) 2 MG/ML injection Inject 1.5 mg into the muscle See admin instructions. Inject 0.75 ml (1.5 mg) intramuscularly every 24 hours as needed for seizure activity   Yes Historical Provider, MD  Multiple Vitamins-Minerals (CERTAVITE/ANTIOXIDANTS) TABS Take 1 tablet by mouth daily.   Yes Historical Provider, MD  OXYGEN Inhale into the lungs as needed (to maintain SATS >90%).   Yes Historical Provider, MD  polyethylene glycol (MIRALAX / GLYCOLAX) packet Take 17 g by mouth every other day. Mix in 4-8 oz of liquid and drink   Yes Historical Provider, MD  Vitamin D, Ergocalciferol, (DRISDOL) 50000 UNITS CAPS capsule Take 50,000 Units by mouth every 30 (thirty) days. On the 1st of each month   Yes Historical Provider, MD   Dg Knee  1-2 Views Right  09/05/2015  CLINICAL DATA:  Fall.  Right knee pain. EXAM: RIGHT KNEE - 1-2 VIEW COMPARISON:  None. FINDINGS: There is a comminuted fracture of the distal metaphysis of the right femur, likely non articular, with 14 mm overriding of the fracture fragments and a 1 cm medial displacement of the dominant distal fracture fragment. There is prominent soft tissue swelling surrounding the fracture site. No malalignment is seen at the right knee joint. No definite right knee joint effusion. No suspicious focal osseous lesion in the right knee. Diffuse osteopenia. IMPRESSION:  Comminuted, overriding, displaced fracture of the distal metaphysis of the right femur, likely non articular. Diffuse osteopenia. Electronically Signed   By: Delbert PhenixJason A Poff M.D.   On: 09/05/2015 19:14   Ct Knee Right Wo Contrast  09/05/2015  CLINICAL DATA:  Evaluate femur fractures.  Fell yesterday. EXAM: CT OF THE right KNEE WITHOUT CONTRAST TECHNIQUE: Multidetector CT imaging of the right knee was performed according to the standard protocol. Multiplanar CT image reconstructions were also generated. COMPARISON:  Radiographs 09/05/2015 FINDINGS: Nondisplaced comminuted fracture of the distal femoral shaft extending down into the supracondylar areas bilaterally, slightly lower on the lateral side than the medial side. No involvement of the intertrochanteric notch or articular surface. Moderate to advanced osteoporosis is noted. The tibia, fibula and patella are intact. No joint effusion. On the sagittal reformatted images the ACL PCL appear intact. Mild tricompartmental degenerative changes are noted. The quadriceps and patellar tendons are intact. IMPRESSION: Relatively nondisplaced comminuted distal femoral/supracondylar femur fracture. No intra-articular involvement. Electronically Signed   By: Rudie MeyerP.  Gallerani M.D.   On: 09/05/2015 21:03   Chest Portable 1 View  09/05/2015  CLINICAL DATA:  Femur fracture yesterday. Preoperative evaluation. History of dementia, hypertension, thrombocytopenia. EXAM: PORTABLE CHEST 1 VIEW COMPARISON:  Chest radiograph August 02, 2011 FINDINGS: Cardiomediastinal silhouette is unremarkable for this low inspiratory portable examination with crowded vasculature markings. The lungs are clear without pleural effusions or focal consolidations. Trachea projects midline and there is no pneumothorax. Included soft tissue planes and osseous structures are non-suspicious. IMPRESSION: No acute cardiopulmonary process in this low inspiratory portable examination. Electronically Signed   By:  Awilda Metroourtnay  Bloomer M.D.   On: 09/05/2015 22:47   Dg Femur, Min 2 Views Right  09/05/2015  CLINICAL DATA:  Patient fell yesterday. Complaining of right knee pain. EXAM: RIGHT FEMUR 2 VIEWS COMPARISON:  None. FINDINGS: There is an oblique fracture of the distal femur. Fracture extends from the medial meta diaphysis to the lateral metaphysis. There is a secondary butterfly fracture component along the medial aspect. There is no fracture dislocation or angulation. No other fractures.  Knee and hip joints are normally aligned. Bones are diffusely demineralized. IMPRESSION: Nondisplaced fracture of the distal right femur as described. Electronically Signed   By: Amie Portlandavid  Ormond M.D.   On: 09/05/2015 19:12    Positive ROS: All other systems have been reviewed and were otherwise negative with the exception of those mentioned in the HPI and as above.  Physical Exam: General: NAD Cardiovascular: No pedal edema Respiratory: No cyanosis, no use of accessory musculature GI: No organomegaly, abdomen is soft and non-tender Skin: No lesions in the area of chief complaint Neurologic: Sensation intact distally Psychiatric: Patient is demented Lymphatic: No axillary or cervical lymphadenopathy  MUSCULOSKELETAL:  - severe pain with movement of the extremity - chronic flexion contracture of knee - skin intact - NVI distally - compartments soft  Assessment: Right femur fracture  Plan: -  nonop is recommended, as patient is nonambulatory and has flexion contracture - NWB - will order brace for patient  Thank you for the consult and the opportunity to see [name], MD [facility] [phone] 7:19 AM

## 2015-09-06 NOTE — Progress Notes (Signed)
Orthopedic Tech Progress Note Patient Details:  Jason Burgess [birth year] [id number] Did not apply Velcro knee immobilizer as previously ordered due to current order of long leg splint. Patient ID: Jason Burgess, male   DOB: [birth year], 69 y.o.   MRN: [id number]   Lesle ChrisGilliland, Romano Stigger L 09/06/2015, 12:11 AM

## 2015-09-07 LAB — VITAMIN D 25 HYDROXY (VIT D DEFICIENCY, FRACTURES): VIT D 25 HYDROXY: 38.1 ng/mL (ref 30.0–100.0)

## 2015-09-07 MED ORDER — HYDROCODONE-ACETAMINOPHEN 5-325 MG PO TABS: 1.0000 | ORAL_TABLET | Freq: Four times a day (QID) | ORAL | Status: DC | PRN

## 2015-09-07 MED ORDER — METHOCARBAMOL 500 MG PO TABS: 500.0000 mg | ORAL_TABLET | Freq: Four times a day (QID) | ORAL | Status: AC | PRN

## 2015-09-07 NOTE — Discharge Summary (Signed)
Physician Discharge Summary  Brick Ketcher ZOX:[id number] DOB: 14-Sep-1946 DOA: 09/05/2015  PCP: No primary care provider on file.  Admit date: 09/05/2015 Discharge date: 09/07/2015  Time spent: 35 minutes  Recommendations for Outpatient Follow-up:  1. NWB on left leg 2. brace  Discharge Diagnoses:  Principal Problem:   Fracture of distal femur (HCC) Active Problems:   Epilepsy (HCC)   Parkinson disease (HCC)   Dementia without behavioral disturbance   Unspecified intellectual disabilities   Hypertension   Wheelchair bound   Discharge Condition: improved  Diet recommendation: cardiac  Filed Weights   09/06/15 0000  Weight: 68.856 kg (151 lb 12.8 oz)    History of present illness:  Jason Burgess is a 69 y.o. male with Past medical history of seizures, Parkinson's disease, dementia, chronic ureter status, intellectual disability . The patient is presenting with complaints of a fall. Patient is a resident at [facility] nursing home. Patient is chronic wheelchair-bound. Today the wheelchair was not locked and why the patient was trying to stand up he suddenly lost his balance and fell on the ground. Patient denies any passing out episode and recurrent fall any chest pain abdominal pain nausea vomiting any headache any neck pain any focal deficit. Patient generally has some constipation. Patient denies any burning urination. Patient generally helps in transferring him from wheelchair to bed.  The patient is coming from SNF.  At his baseline ambulates with a wheelchair And is dependent for most of his ADL; does not manages his medication on his own.  Hospital Course:  Fracture of distal femur (HCC) - mechanical fall. -non-operative per Dr. Roda Shutters: NWB, brace -patient from [facility]- can return  Epilepsy (HCC) Keppra.  Dementia without behavioral disturbance Currently the patient is cooperative and not having any DR disturbances. Monitor closely for  delirium.  Hypertension stable  Procedures:    Consultations:  Ortho- xu  Discharge Exam: Filed Vitals:   09/07/15 0558  BP: 126/45  Pulse: 78  Temp: 100.3 F (37.9 C)  Resp: 16    General: awake, NAD   Discharge Instructions   Discharge Instructions    Diet - low sodium heart healthy    Complete by:  As directed      Discharge instructions    Complete by:  As directed   NWB Brace for patient Incentive spirometry     Increase activity slowly    Complete by:  As directed           Current Discharge Medication List    START taking these medications   Details  HYDROcodone-acetaminophen (NORCO/VICODIN) 5-325 MG tablet Take 1-2 tablets by mouth every 6 (six) hours as needed for moderate pain. Qty: 30 tablet, Refills: 0    methocarbamol (ROBAXIN) 500 MG tablet Take 1 tablet (500 mg total) by mouth every 6 (six) hours as needed for muscle spasms. Qty: 15 tablet, Refills: 0      CONTINUE these medications which have NOT CHANGED   Details  acetaminophen (TYLENOL) 325 MG tablet Take 650 mg by mouth every 4 (four) hours as needed for fever (pain).    aspirin EC 81 MG tablet Take 81 mg by mouth daily.    bisacodyl (DULCOLAX) 5 MG EC tablet Take 10 mg by mouth at bedtime.    chlorhexidine (PERIDEX) 0.12 % solution Place 5-10 mLs onto teeth 2 (two) times daily. Dip toothbrush in 5-10 mls and liberally brush teeth twice daily    furosemide (LASIX) 20 MG tablet Take 20 mg by  mouth daily.    !! levETIRAcetam (KEPPRA) 1000 MG tablet Take 1,000 mg by mouth 2 (two) times daily. Take with a 250 mg tablet for a 1250 mg dose.    !! levETIRAcetam (KEPPRA) 250 MG tablet Take 250 mg by mouth 2 (two) times daily. Take with a 1000 mg tablet for a 1250 mg dose    LORazepam (ATIVAN) 2 MG/ML injection Inject 1.5 mg into the muscle See admin instructions. Inject 0.75 ml (1.5 mg) intramuscularly every 24 hours as needed for seizure activity    Multiple Vitamins-Minerals  (CERTAVITE/ANTIOXIDANTS) TABS Take 1 tablet by mouth daily.    OXYGEN Inhale into the lungs as needed (to maintain SATS >90%).    polyethylene glycol (MIRALAX / GLYCOLAX) packet Take 17 g by mouth every other day. Mix in 4-8 oz of liquid and drink    Vitamin D, Ergocalciferol, (DRISDOL) 50000 UNITS CAPS capsule Take 50,000 Units by mouth every 30 (thirty) days. On the 1st of each month     !! - Potential duplicate medications found. Please discuss with provider.     No Known Allergies Follow-up Information    Follow up with Cheral AlmasXu, Naiping Michael, MD In 1 week.   Specialty:  Orthopedic Surgery   Contact information:   [address] [place] [address] [phone]        The results of significant diagnostics from this hospitalization (including imaging, microbiology, ancillary and laboratory) are listed below for reference.    Significant Diagnostic Studies: Dg Knee 1-2 Views Right  09/05/2015  CLINICAL DATA:  Fall.  Right knee pain. EXAM: RIGHT KNEE - 1-2 VIEW COMPARISON:  None. FINDINGS: There is a comminuted fracture of the distal metaphysis of the right femur, likely non articular, with 14 mm overriding of the fracture fragments and a 1 cm medial displacement of the dominant distal fracture fragment. There is prominent soft tissue swelling surrounding the fracture site. No malalignment is seen at the right knee joint. No definite right knee joint effusion. No suspicious focal osseous lesion in the right knee. Diffuse osteopenia. IMPRESSION: Comminuted, overriding, displaced fracture of the distal metaphysis of the right femur, likely non articular. Diffuse osteopenia. Electronically Signed   By: Delbert PhenixJason A Poff M.D.   On: 09/05/2015 19:14   Ct Knee Right Wo Contrast  09/05/2015  CLINICAL DATA:  Evaluate femur fractures.  Fell yesterday. EXAM: CT OF THE right KNEE WITHOUT CONTRAST TECHNIQUE: Multidetector CT imaging of the right knee was performed according to the standard  protocol. Multiplanar CT image reconstructions were also generated. COMPARISON:  Radiographs 09/05/2015 FINDINGS: Nondisplaced comminuted fracture of the distal femoral shaft extending down into the supracondylar areas bilaterally, slightly lower on the lateral side than the medial side. No involvement of the intertrochanteric notch or articular surface. Moderate to advanced osteoporosis is noted. The tibia, fibula and patella are intact. No joint effusion. On the sagittal reformatted images the ACL PCL appear intact. Mild tricompartmental degenerative changes are noted. The quadriceps and patellar tendons are intact. IMPRESSION: Relatively nondisplaced comminuted distal femoral/supracondylar femur fracture. No intra-articular involvement. Electronically Signed   By: Rudie MeyerP.  Gallerani M.D.   On: 09/05/2015 21:03   Chest Portable 1 View  09/05/2015  CLINICAL DATA:  Femur fracture yesterday. Preoperative evaluation. History of dementia, hypertension, thrombocytopenia. EXAM: PORTABLE CHEST 1 VIEW COMPARISON:  Chest radiograph August 02, 2011 FINDINGS: Cardiomediastinal silhouette is unremarkable for this low inspiratory portable examination with crowded vasculature markings. The lungs are clear without pleural effusions or focal consolidations.  Trachea projects midline and there is no pneumothorax. Included soft tissue planes and osseous structures are non-suspicious. IMPRESSION: No acute cardiopulmonary process in this low inspiratory portable examination. Electronically Signed   By: Awilda Metro M.D.   On: 09/05/2015 22:47   Dg Femur, Min 2 Views Right  09/05/2015  CLINICAL DATA:  Patient fell yesterday. Complaining of right knee pain. EXAM: RIGHT FEMUR 2 VIEWS COMPARISON:  None. FINDINGS: There is an oblique fracture of the distal femur. Fracture extends from the medial meta diaphysis to the lateral metaphysis. There is a secondary butterfly fracture component along the medial aspect. There is no fracture  dislocation or angulation. No other fractures.  Knee and hip joints are normally aligned. Bones are diffusely demineralized. IMPRESSION: Nondisplaced fracture of the distal right femur as described. Electronically Signed   By: Amie Portland M.D.   On: 09/05/2015 19:12    Microbiology: No results found for this or any previous visit (from the past 240 hour(s)).   Labs: Basic Metabolic Panel:  Recent Labs Lab 09/05/15 1927  NA 139  K 4.7  CL 105  CO2 28  GLUCOSE 105*  BUN 12  CREATININE 0.91  CALCIUM 9.0   Liver Function Tests: No results for input(s): AST, ALT, ALKPHOS, BILITOT, PROT, ALBUMIN in the last 168 hours. No results for input(s): LIPASE, AMYLASE in the last 168 hours. No results for input(s): AMMONIA in the last 168 hours. CBC:  Recent Labs Lab 09/05/15 1927  WBC 8.7  NEUTROABS 5.3  HGB 11.9*  HCT 36.5*  MCV 90.1  PLT 154   Cardiac Enzymes: No results for input(s): CKTOTAL, CKMB, CKMBINDEX, TROPONINI in the last 168 hours. BNP: BNP (last 3 results) No results for input(s): BNP in the last 8760 hours.  ProBNP (last 3 results) No results for input(s): PROBNP in the last 8760 hours.  CBG: No results for input(s): GLUCAP in the last 168 hours.     SignedMarlin Canary  [facility] 09/07/2015, 10:53 AM

## 2015-09-07 NOTE — Clinical Social Work Note (Signed)
Clinical Social Work Assessment  Patient Details  Name: Jason AversWilliam Latour MRN: [id number] Date of Birth: 02/28/1946  Date of referral:  09/07/15               Reason for consult:  Facility Placement                Permission sought to share information with:    Permission granted to share information::   (patient has dementia  Bother is caregiver)  Name::      [name]([place]er[place] [phone])  Agency::   [facility])  Relationship::     Contact Information:     Housing/Transportation Living arrangements for the past 2 months:  Skilled Nursing Facility Source of Information:   (Sibling Frederick) Patient Interpreter Needed:  None Criminal Activity/Legal Involvement Pertinent to Current Situation/Hospitalization:  No - Comment as needed Significant Relationships:  Siblings (brother) Lives with:    Do you feel safe going back to the place where you live?  Yes (SNF) Need for family participation in patient care:  Yes (Comment) (Patient has dementia)  Care giving concerns:  No care giving concerns expressed at this time.   Social Worker assessment / plan:  CSW completed assessment with patient's brother, Gelene MinkFrederick who can be reached at [phone].  Gelene MinkFrederick states the [facility] and will return at time of discharge.  Patient is wheelchair bound.  Patient has non-operative femur fx.  Patient is pleasant and MD has orders for discharge today.  Brother is agreeable to discharge plans.  Patient will be transported via [facility].  Employment status:  Disabled (Comment on whether or not currently receiving Disability), Retired Health and safety inspectornsurance information:  Medicare PT Recommendations:  Skilled Nursing Facility Information / Referral to community resources:  Skilled Nursing Facility  Patient/Family's Response to care:  Brother is agreeable for SNF at time of discharge (return)  Patient/Family's Understanding of and Emotional Response to Diagnosis, Current Treatment, and  Prognosis:  Patient's brother is understanding of medical prognosis and plans.  Emotional Assessment Appearance:  Appears stated age Attitude/Demeanor/Rapport:   (appropriate/memory issues) Affect (typically observed):  Accepting, Adaptable Orientation:  Fluctuating Orientation (Suspected and/or reported Sundowners) Alcohol / Substance use:  Not Applicable Psych involvement (Current and /or in the community):  No (Comment)  Discharge Needs  Concerns to be addressed:  No discharge needs identified Readmission within the last 30 days:  No Current discharge risk:  None Barriers to Discharge:  No Barriers Identified   Rondel Batonngle, Dru Primeau C, LCSW 09/07/2015, 11:37 AM

## 2015-09-07 NOTE — NC FL2 (Signed)
[place] MEDICAID FL2 LEVEL OF CARE SCREENING TOOL     IDENTIFICATION  Patient Name: Jason Burgess Birthdate: Sep 07, 1946 Sex: male Admission Date (Current Location): 09/05/2015  [facility] [place]   Facility and Address:  [facility], [address], [place], [place] [address]      Provider Number: [id number]  Attending Physician Name and Address:  Joseph ArtJessica U Vann, DO  Relative Name and Phone Number:  Quillian QuinceBrother Frederick [phone]    Current Level of Care: Hospital Recommended Level of Care: Nursing Facility Prior Approval Number:    Date Approved/Denied:   PASRR Number: existing  Discharge Plan: SNF    Current Diagnoses: Patient Active Problem List   Diagnosis Date Noted  . Femur fracture (HCC) 09/05/2015  . Fracture of distal femur (HCC) 09/05/2015  . Wheelchair bound 09/05/2015  . Epilepsy (HCC)   . Parkinson disease (HCC)   . Dementia without behavioral disturbance   . Unspecified intellectual disabilities   . Hypertension     Orientation ACTIVITIES/SOCIAL BLADDER RESPIRATION    Self    Incontinent    BEHAVIORAL SYMPTOMS/MOOD NEUROLOGICAL BOWEL NUTRITION STATUS      Incontinent Diet  PHYSICIAN VISITS COMMUNICATION OF NEEDS Height & Weight Skin    Verbally   151 lbs. Surgical wounds          AMBULATORY STATUS RESPIRATION     (wheelchair bound)        Personal Care Assistance Level of Assistance  Dressing     Dressing Assistance: Maximum assistance      Functional Limitations Info                SPECIAL CARE FACTORS FREQUENCY                      Additional Factors Info                  Current Medications (09/07/2015): Current Facility-Administered Medications  Medication Dose Route Frequency Provider Last Rate Last Dose  . acetaminophen (TYLENOL) tablet 650 mg  650 mg Oral Q6H PRN Rolly SalterPranav M Patel, MD      . aspirin EC tablet 81 mg  81 mg Oral Daily Rolly SalterPranav M Patel, MD    81 mg at 09/07/15 0908  . bisacodyl (DULCOLAX) EC tablet 10 mg  10 mg Oral QHS Rolly SalterPranav M Patel, MD   10 mg at 09/06/15 2138  . chlorhexidine (PERIDEX) 0.12 % solution 5-10 mL  5-10 mL Mouth/Throat BID Rolly SalterPranav M Patel, MD   10 mL at 09/07/15 0908  . enoxaparin (LOVENOX) injection 40 mg  40 mg Subcutaneous QHS Rolly SalterPranav M Patel, MD   40 mg at 09/06/15 2138  . HYDROcodone-acetaminophen (NORCO/VICODIN) 5-325 MG per tablet 1-2 tablet  1-2 tablet Oral Q6H PRN Rolly SalterPranav M Patel, MD      . levETIRAcetam (KEPPRA) tablet 1,250 mg  1,250 mg Oral BID Rolly SalterPranav M Patel, MD   1,250 mg at 09/07/15 0908  . methocarbamol (ROBAXIN) tablet 500 mg  500 mg Oral Q6H PRN Rolly SalterPranav M Patel, MD       Or  . methocarbamol (ROBAXIN) 500 mg in dextrose 5 % 50 mL IVPB  500 mg Intravenous Q6H PRN Rolly SalterPranav M Patel, MD      . ondansetron [facility](ZOFRAN) injection 4 mg  4 mg Intravenous Q6H PRN Rolly SalterPranav M Patel, MD      . polyethylene glycol (MIRALAX / GLYCOLAX) packet 17 g  17 g Oral  Mardene Celeste, MD   17 g at 09/06/15 1014   Do not use this list as official medication orders. Please verify with discharge summary.  Discharge Medications:   Medication List    TAKE these medications        acetaminophen 325 MG tablet  Commonly known as:  TYLENOL  Take 650 mg by mouth every 4 (four) hours as needed for fever (pain).     aspirin EC 81 MG tablet  Take 81 mg by mouth daily.     bisacodyl 5 MG EC tablet  Commonly known as:  DULCOLAX  Take 10 mg by mouth at bedtime.     CERTAVITE/ANTIOXIDANTS Tabs  Take 1 tablet by mouth daily.     chlorhexidine 0.12 % solution  Commonly known as:  PERIDEX  Place 5-10 mLs onto teeth 2 (two) times daily. Dip toothbrush in 5-10 mls and liberally brush teeth twice daily     furosemide 20 MG tablet  Commonly known as:  LASIX  Take 20 mg by mouth daily.     HYDROcodone-acetaminophen 5-325 MG tablet  Commonly known as:  NORCO/VICODIN  Take 1-2 tablets by mouth every 6 (six) hours as needed for moderate  pain.     levETIRAcetam 1000 MG tablet  Commonly known as:  KEPPRA  Take 1,000 mg by mouth 2 (two) times daily. Take with a 250 mg tablet for a 1250 mg dose.     levETIRAcetam 250 MG tablet  Commonly known as:  KEPPRA  Take 250 mg by mouth 2 (two) times daily. Take with a 1000 mg tablet for a 1250 mg dose     LORazepam 2 MG/ML injection  Commonly known as:  ATIVAN  Inject 1.5 mg into the muscle See admin instructions. Inject 0.75 ml (1.5 mg) intramuscularly every 24 hours as needed for seizure activity     methocarbamol 500 MG tablet  Commonly known as:  ROBAXIN  Take 1 tablet (500 mg total) by mouth every 6 (six) hours as needed for muscle spasms.     OXYGEN  Inhale into the lungs as needed (to maintain SATS >90%).     polyethylene glycol packet  Commonly known as:  MIRALAX / GLYCOLAX  Take 17 g by mouth every other day. Mix in 4-8 oz of liquid and drink     Vitamin D (Ergocalciferol) 50000 UNITS Caps capsule  Commonly known as:  DRISDOL  Take 50,000 Units by mouth every 30 (thirty) days. On the 1st of each month        Relevant Imaging Results:  Relevant Lab Results:  Recent Labs    Additional Information SSN [id number]  Rondel Baton, LCSW

## 2015-12-18 ENCOUNTER — Encounter (HOSPITAL_COMMUNITY): Payer: Self-pay | Admitting: Emergency Medicine

## 2015-12-18 ENCOUNTER — Observation Stay (HOSPITAL_COMMUNITY)
Admission: EM | Admit: 2015-12-18 | Discharge: 2015-12-18 | Disposition: A | Discharge status: Home / Self Care | Payer: Medicare Other | Attending: Internal Medicine | Admitting: Internal Medicine

## 2015-12-18 ENCOUNTER — Emergency Department (HOSPITAL_COMMUNITY): Payer: Medicare Other

## 2015-12-18 DIAGNOSIS — G40909 Epilepsy, unspecified, not intractable, without status epilepticus: Secondary | ICD-10-CM | POA: Diagnosis not present

## 2015-12-18 DIAGNOSIS — D696 Thrombocytopenia, unspecified: Secondary | ICD-10-CM | POA: Diagnosis not present

## 2015-12-18 DIAGNOSIS — Y9389 Activity, other specified: Secondary | ICD-10-CM | POA: Insufficient documentation

## 2015-12-18 DIAGNOSIS — G20 Parkinson's disease: Secondary | ICD-10-CM | POA: Diagnosis not present

## 2015-12-18 DIAGNOSIS — I10 Essential (primary) hypertension: Secondary | ICD-10-CM | POA: Diagnosis not present

## 2015-12-18 DIAGNOSIS — D649 Anemia, unspecified: Secondary | ICD-10-CM | POA: Diagnosis not present

## 2015-12-18 DIAGNOSIS — Z7982 Long term (current) use of aspirin: Secondary | ICD-10-CM | POA: Diagnosis not present

## 2015-12-18 DIAGNOSIS — E222 Syndrome of inappropriate secretion of antidiuretic hormone: Secondary | ICD-10-CM | POA: Insufficient documentation

## 2015-12-18 DIAGNOSIS — Y9289 Other specified places as the place of occurrence of the external cause: Secondary | ICD-10-CM | POA: Diagnosis not present

## 2015-12-18 DIAGNOSIS — W050XXA Fall from non-moving wheelchair, initial encounter: Secondary | ICD-10-CM | POA: Diagnosis not present

## 2015-12-18 DIAGNOSIS — Z993 Dependence on wheelchair: Secondary | ICD-10-CM | POA: Diagnosis not present

## 2015-12-18 DIAGNOSIS — I739 Peripheral vascular disease, unspecified: Secondary | ICD-10-CM | POA: Insufficient documentation

## 2015-12-18 DIAGNOSIS — S72002A Fracture of unspecified part of neck of left femur, initial encounter for closed fracture: Secondary | ICD-10-CM | POA: Diagnosis present

## 2015-12-18 DIAGNOSIS — M1990 Unspecified osteoarthritis, unspecified site: Secondary | ICD-10-CM | POA: Diagnosis not present

## 2015-12-18 DIAGNOSIS — F79 Unspecified intellectual disabilities: Secondary | ICD-10-CM

## 2015-12-18 DIAGNOSIS — G20A1 Parkinson's disease without dyskinesia, without mention of fluctuations: Secondary | ICD-10-CM | POA: Diagnosis present

## 2015-12-18 DIAGNOSIS — Z888 Allergy status to other drugs, medicaments and biological substances status: Secondary | ICD-10-CM | POA: Insufficient documentation

## 2015-12-18 DIAGNOSIS — R6250 Unspecified lack of expected normal physiological development in childhood: Secondary | ICD-10-CM | POA: Insufficient documentation

## 2015-12-18 DIAGNOSIS — Y998 Other external cause status: Secondary | ICD-10-CM | POA: Diagnosis not present

## 2015-12-18 DIAGNOSIS — G40804 Other epilepsy, intractable, without status epilepticus: Secondary | ICD-10-CM

## 2015-12-18 DIAGNOSIS — S72002D Fracture of unspecified part of neck of left femur, subsequent encounter for closed fracture with routine healing: Secondary | ICD-10-CM

## 2015-12-18 DIAGNOSIS — S72009A Fracture of unspecified part of neck of unspecified femur, initial encounter for closed fracture: Secondary | ICD-10-CM | POA: Diagnosis present

## 2015-12-18 DIAGNOSIS — F0390 Unspecified dementia without behavioral disturbance: Secondary | ICD-10-CM | POA: Diagnosis present

## 2015-12-18 DIAGNOSIS — S72092A Other fracture of head and neck of left femur, initial encounter for closed fracture: Principal | ICD-10-CM | POA: Insufficient documentation

## 2015-12-18 LAB — CBC WITH DIFFERENTIAL/PLATELET
BASOS PCT: 0 %
Basophils Absolute: 0 10*3/uL (ref 0.0–0.1)
Eosinophils Absolute: 0.7 10*3/uL (ref 0.0–0.7)
Eosinophils Relative: 8 %
HEMATOCRIT: 35.3 % — AB (ref 39.0–52.0)
HEMOGLOBIN: 11.4 g/dL — AB (ref 13.0–17.0)
LYMPHS ABS: 1.4 10*3/uL (ref 0.7–4.0)
Lymphocytes Relative: 16 %
MCH: 27.9 pg (ref 26.0–34.0)
MCHC: 32.3 g/dL (ref 30.0–36.0)
MCV: 86.5 fL (ref 78.0–100.0)
MONOS PCT: 12 %
Monocytes Absolute: 1 10*3/uL (ref 0.1–1.0)
NEUTROS ABS: 5.8 10*3/uL (ref 1.7–7.7)
NEUTROS PCT: 64 %
Platelets: 133 10*3/uL — ABNORMAL LOW (ref 150–400)
RBC: 4.08 MIL/uL — AB (ref 4.22–5.81)
RDW: 14.5 % (ref 11.5–15.5)
WBC: 8.9 10*3/uL (ref 4.0–10.5)

## 2015-12-18 LAB — BASIC METABOLIC PANEL
Anion gap: 11 (ref 5–15)
BUN: 15 mg/dL (ref 6–20)
CHLORIDE: 107 mmol/L (ref 101–111)
CO2: 23 mmol/L (ref 22–32)
CREATININE: 0.78 mg/dL (ref 0.61–1.24)
Calcium: 9.2 mg/dL (ref 8.9–10.3)
GFR calc non Af Amer: >60 mL/min (ref >60)
Glucose, Bld: 104 mg/dL — ABNORMAL HIGH (ref 65–99)
POTASSIUM: 4.2 mmol/L (ref 3.5–5.1)
SODIUM: 141 mmol/L (ref 135–145)

## 2015-12-18 LAB — FERRITIN: Ferritin: 121 ng/mL (ref 24–336)

## 2015-12-18 MED ORDER — LORAZEPAM 2 MG/ML IJ SOLN: 1.5000 mg | INTRAMUSCULAR | Status: AC

## 2015-12-18 MED ORDER — ENOXAPARIN SODIUM 30 MG/0.3ML ~~LOC~~ SOLN
30.0000 mg | SUBCUTANEOUS | Status: DC
  Administered 2015-12-18: 30 mg via SUBCUTANEOUS
  Filled 2015-12-18: qty 0.3

## 2015-12-18 MED ORDER — ENOXAPARIN SODIUM 30 MG/0.3ML ~~LOC~~ SOLN: 30.0000 mg | SUBCUTANEOUS | Status: AC

## 2015-12-18 MED ORDER — MORPHINE SULFATE (PF) 2 MG/ML IV SOLN: 0.5000 mg | INTRAVENOUS | Status: DC | PRN

## 2015-12-18 MED ORDER — CALCIUM CARBONATE ANTACID 500 MG PO CHEW
1.0000 | CHEWABLE_TABLET | Freq: Three times a day (TID) | ORAL | Status: DC
  Administered 2015-12-18: 200 mg via ORAL
  Filled 2015-12-18 (×4): qty 1

## 2015-12-18 MED ORDER — METHOCARBAMOL 500 MG PO TABS: 500.0000 mg | ORAL_TABLET | Freq: Four times a day (QID) | ORAL | Status: DC | PRN

## 2015-12-18 MED ORDER — HYDROCODONE-ACETAMINOPHEN 5-325 MG PO TABS: 1.0000 | ORAL_TABLET | Freq: Four times a day (QID) | ORAL | Status: DC | PRN

## 2015-12-18 MED ORDER — LORATADINE 10 MG PO TABS
10.0000 mg | ORAL_TABLET | Freq: Every day | ORAL | Status: DC
  Administered 2015-12-18: 10 mg via ORAL
  Filled 2015-12-18 (×2): qty 1

## 2015-12-18 MED ORDER — ROSUVASTATIN CALCIUM 5 MG PO TABS
5.0000 mg | ORAL_TABLET | Freq: Every evening | ORAL | Status: DC
  Filled 2015-12-18: qty 1

## 2015-12-18 MED ORDER — BISACODYL 5 MG PO TBEC
10.0000 mg | DELAYED_RELEASE_TABLET | Freq: Every day | ORAL | Status: DC
  Filled 2015-12-18: qty 2

## 2015-12-18 MED ORDER — LEVETIRACETAM 750 MG PO TABS
1250.0000 mg | ORAL_TABLET | Freq: Two times a day (BID) | ORAL | Status: DC
  Administered 2015-12-18: 1250 mg via ORAL
  Filled 2015-12-18 (×2): qty 5

## 2015-12-18 MED ORDER — HYDROCODONE-ACETAMINOPHEN 5-325 MG PO TABS: 1.0000 | ORAL_TABLET | Freq: Four times a day (QID) | ORAL | Status: AC | PRN

## 2015-12-18 MED ORDER — FUROSEMIDE 20 MG PO TABS
20.0000 mg | ORAL_TABLET | Freq: Every day | ORAL | Status: DC
  Administered 2015-12-18: 20 mg via ORAL
  Filled 2015-12-18: qty 1

## 2015-12-18 MED ORDER — LORATADINE 10 MG PO TABS: 10.0000 mg | ORAL_TABLET | Freq: Every day | ORAL | Status: AC

## 2015-12-18 MED ORDER — LEVETIRACETAM 500 MG PO TABS: 1000.0000 mg | ORAL_TABLET | Freq: Two times a day (BID) | ORAL | Status: DC

## 2015-12-18 MED ORDER — ASPIRIN EC 81 MG PO TBEC
81.0000 mg | DELAYED_RELEASE_TABLET | Freq: Every day | ORAL | Status: DC
  Administered 2015-12-18: 81 mg via ORAL
  Filled 2015-12-18: qty 1

## 2015-12-18 MED ORDER — ACETAMINOPHEN 325 MG PO TABS: 650.0000 mg | ORAL_TABLET | ORAL | Status: DC | PRN

## 2015-12-18 NOTE — ED Notes (Signed)
Pt responds to voice but appears to be very tired, apparently he had to be woken up to transport to the hospital.

## 2015-12-18 NOTE — ED Provider Notes (Signed)
CSN: [id number]     Arrival date & time 12/18/15  0010 History  By signing my name below, I, Arianna Nassar, attest that this documentation has been prepared under the direction and in the presence of Tomasita Crumble, MD. Electronically Signed: Octavia Heir, ED Scribe. 12/18/2015. 12:22 AM.    No chief complaint on file.  LEVEL 5 CAVEAT DUE TO DEMENTIA  The history is provided by the EMS personnel and the nursing home. The history is limited by the condition of the patient.   HPI Comments: Jason Burgess is a 71 y.o. male who has a hx of epilepsy, SIADH, parkinson's disease, dementia,HTN, and PVD presents to the Emergency Department complaining of left femoral neck fracture onset two days ago. Per nursing home, pt fell out of his wheelchair on 2/15. It was discovered earlier via portable x-ray that pt has a "left hip and pelvis fracture". There was no LOC or head injury. Pt appears to have pain with movement.   Past Medical History  Diagnosis Date  . Epilepsy (HCC)   . SIADH (syndrome of inappropriate ADH production) (HCC)   . Parkinson disease (HCC)   . Arthritis   . Dementia without behavioral disturbance   . Dysarthria and anarthria   . Thrombocytopenia (HCC)   . Muscle weakness (generalized)   . Unspecified intellectual disabilities   . Hypertension   . PVD (peripheral vascular disease) (HCC)   . Lack of coordination    No past surgical history on file. No family history on file. Social History  Substance Use Topics  . Smoking status: Never Smoker   . Smokeless tobacco: Not on file  . Alcohol Use: No    Review of Systems  LEVEL 5 CAVEAT DUE TO DEMENTIA  Allergies  Review of patient's allergies indicates no known allergies.  Home Medications   Prior to Admission medications   Medication Sig Start Date End Date Taking? Authorizing Provider  acetaminophen (TYLENOL) 325 MG tablet Take 650 mg by mouth every 4 (four) hours as needed for fever (pain).    Historical Provider,  MD  aspirin EC 81 MG tablet Take 81 mg by mouth daily.    Historical Provider, MD  bisacodyl (DULCOLAX) 5 MG EC tablet Take 10 mg by mouth at bedtime.    Historical Provider, MD  chlorhexidine (PERIDEX) 0.12 % solution Place 5-10 mLs onto teeth 2 (two) times daily. Dip toothbrush in 5-10 mls and liberally brush teeth twice daily    Historical Provider, MD  furosemide (LASIX) 20 MG tablet Take 20 mg by mouth daily.    Historical Provider, MD  HYDROcodone-acetaminophen (NORCO/VICODIN) 5-325 MG tablet Take 1-2 tablets by mouth every 6 (six) hours as needed for moderate pain. 09/07/15   Joseph Art, DO  levETIRAcetam (KEPPRA) 1000 MG tablet Take 1,000 mg by mouth 2 (two) times daily. Take with a 250 mg tablet for a 1250 mg dose.    Historical Provider, MD  levETIRAcetam (KEPPRA) 250 MG tablet Take 250 mg by mouth 2 (two) times daily. Take with a 1000 mg tablet for a 1250 mg dose    Historical Provider, MD  LORazepam (ATIVAN) 2 MG/ML injection Inject 1.5 mg into the muscle See admin instructions. Inject 0.75 ml (1.5 mg) intramuscularly every 24 hours as needed for seizure activity    Historical Provider, MD  methocarbamol (ROBAXIN) 500 MG tablet Take 1 tablet (500 mg total) by mouth every 6 (six) hours as needed for muscle spasms. 09/07/15   Joseph Art,  DO  Multiple Vitamins-Minerals (CERTAVITE/ANTIOXIDANTS) TABS Take 1 tablet by mouth daily.    Historical Provider, MD  OXYGEN Inhale into the lungs as needed (to maintain SATS >90%).    Historical Provider, MD  polyethylene glycol (MIRALAX / GLYCOLAX) packet Take 17 g by mouth every other day. Mix in 4-8 oz of liquid and drink    Historical Provider, MD  Vitamin D, Ergocalciferol, (DRISDOL) 50000 UNITS CAPS capsule Take 50,000 Units by mouth every 30 (thirty) days. On the 1st of each month    Historical Provider, MD   Triage vitals: BP 139/64 mmHg  Pulse 79  Temp(Src) 99.2 F (37.3 C) (Oral)  Resp 16  SpO2 94% Physical Exam  Constitutional:  Vital signs are normal. He appears well-developed and well-nourished.  Non-toxic appearance. He does not appear ill. No distress.  HENT:  Head: Normocephalic and atraumatic.  Nose: Nose normal.  Mouth/Throat: Oropharynx is clear and moist. No oropharyngeal exudate.  Eyes: Conjunctivae and EOM are normal. Pupils are equal, round, and reactive to light. No scleral icterus.  Neck: Normal range of motion. Neck supple. No tracheal deviation, no edema, no erythema and normal range of motion present. No thyroid mass and no thyromegaly present.  Cardiovascular: Normal rate, regular rhythm, S1 normal, S2 normal, normal heart sounds, intact distal pulses and normal pulses.  Exam reveals no gallop and no friction rub.   No murmur heard. Pulmonary/Chest: Effort normal and breath sounds normal. No respiratory distress. He has no wheezes. He has no rhonchi. He has no rales.  Abdominal: Soft. Normal appearance and bowel sounds are normal. He exhibits no distension, no ascites and no mass. There is no hepatosplenomegaly. There is no tenderness. There is no rebound, no guarding and no CVA tenderness.  Musculoskeletal: He exhibits tenderness. He exhibits no edema.  TTP over left hip  Lymphadenopathy:    He has no cervical adenopathy.  Neurological: He is alert. He has normal strength. No cranial nerve deficit or sensory deficit.  Skin: Skin is warm, dry and intact. No petechiae and no rash noted. He is not diaphoretic. No erythema. No pallor.  Nursing note and vitals reviewed.   ED Course  Procedures  DIAGNOSTIC STUDIES: Oxygen Saturation is 94% on RA, low by my interpretation.  COORDINATION OF CARE:  12:42 AM Discussed treatment plan with pt at bedside.  Labs Review  Labs Reviewed  CBC WITH DIFFERENTIAL/PLATELET - Abnormal; Notable for the following:    RBC 4.08 (*)    Hemoglobin 11.4 (*)    HCT 35.3 (*)    Platelets 133 (*)    All other components within normal limits  BASIC METABOLIC PANEL -  Abnormal; Notable for the following:    Glucose, Bld 104 (*)    All other components within normal limits    Imaging Review Dg Hip Unilat With Pelvis 2-3 Views Left  12/18/2015  CLINICAL DATA:  Patient fell out of wheelchair on the fifteenth. Mobile x-ray suggested left hip and pelvic fracture. EXAM: DG HIP (WITH OR WITHOUT PELVIS) 2-3V LEFT COMPARISON:  None. FINDINGS: The limited study due to patient positioning. Diffuse bone demineralization. Apparent fracture through the left femoral neck with varus angulation of the fracture fragments. No dislocation at the hip joint. Pelvis appears intact. No gross displaced fracture identified. Due to patient rotation however visualization of the left pelvis is limited. Degenerative changes noted in the lower lumbar spine and hips. IMPRESSION: Fracture of the left femoral neck with varus angulation. Electronically Signed  By: Burman Nieves M.D.   On: 12/18/2015 02:01   I have personally reviewed and evaluated these images and lab results as part of my medical decision-making.   EKG Interpretation   Date/Time:  Friday December 18 2015 02:25:15 EST Ventricular Rate:  87 PR Interval:  157 QRS Duration: 84 QT Interval:  363 QTC Calculation: 437 R Axis:   59 Text Interpretation:  Sinus rhythm Abnormal R-wave progression, early  transition No significant change since last tracing Confirmed by Erroll Luna [phone]) on 12/18/2015 3:09:20 AM      MDM   Final diagnoses:  None    Patient presents to the ED for a femur fracture.  Will repeat images so we have them here and patient will likely require admission. I have paged Dr. Roda Shutters who evaluated him in the past for a R femur fracture.  Dr. Roda Shutters recs for medical admission for pain control. No need to keep NPO as he is not likely a surgical candidate. Will page triad hospitalist for further care.  Xray shows fracture vs varus angulation, however patient is quite tender in that area.  I think  fracuture is favored here.  Patient admitted for further care.   I personally performed the services described in this documentation, which was scribed in my presence. The recorded information has been reviewed and is accurate.     Tomasita Crumble, MD 12/18/15 [phone]

## 2015-12-18 NOTE — Discharge Summary (Signed)
Physician Discharge Summary  Jason Burgess XBJ:[id number] DOB: 1946/06/16 DOA: 12/18/2015  PCP: Colon Branch, MD  Admit date: 12/18/2015 Discharge date: 12/18/2015  Time spent: 60 minutes  Recommendations for Outpatient Follow-up:  1. Discharged back to skilled nursing facility. Patient will follow-up with M.D. at the skilled nursing facility. 2. Follow-up with orthopedics, Dr.Xu in 2 weeks.   Discharge Diagnoses:  Principal Problem:   Fracture of femoral neck, left, closed Active Problems:   Epilepsy (HCC)   Parkinson disease (HCC)   Dementia without behavioral disturbance   Unspecified intellectual disabilities   Hypertension   Wheelchair bound   Anemia, unspecified   Thrombocytopenia (HCC)   Hip fracture (HCC)   Discharge Condition: Stable  Diet recommendation: Regular  There were no vitals filed for this visit.  History of present illness:  Per Dr. Aleen Campi Jason Burgess is a 70 y.o. male with a past medical history significant for developmental delay, dementia, Parkinson's and epilepsy who presented with hip pain after a fall.  All history was collected from the chart and from EDP, as the patient was unable to provide his own history due to dementia. The patient was in his normal state of health until one day prior to admission, when he fell out of his wheelchair. On the day of admission, the patient appeared to staff to have continued pain, and so a radiograph was ordered that reportedly showed fracture, and the patient was transferred to the ED.   In the ED, the patient was afebrile and hemodynamically stable and a plain radiograph of the L hip showed a femoral neck fracture. The case was discussed with Dr. Roda Shutters who agreed to see the patient, and TRH were asked to admit for medical management.   The patient has no active heart issues, angina, or history of MI. He is wheelchair bound and somewhat communicative at baseline. He denied hip pain, fever, chest  discomfort, dyspnea, cough, or abdominal pain, limited by patient mental status.    Hospital Course:  #1 left femoral neck fracture Secondary to mechanical fall. Patient currently comfortable pain seems to be controlled. Patient has been seen in consultation by orthopedics, Dr.Xu was recommended nonoperative treatment. Patient is nonverbal, nonambulatory at baseline with chronic contractures. It was recommended that patient be discharged back to skilled nursing facility on Lovenox for DVT prophylaxis and to follow-up with orthopedics, Dr.Xu in 2 weeks. Patient be discharged in stable and improved condition.  #2 dementia Remained stable.  #3 epilepsy Remained stable. No episodes of epilepsy noted.  The rest of patient's chronic medical issues remained stable throughout the hospitalization patient be discharged in stable condition.  Procedures:  Plain films of the left hip with pelvis 12/18/2015    Consultations:  Orthopedics: Dr.Xu 12/18/2015  Discharge Exam: Filed Vitals:   12/18/15 1030 12/18/15 1045  BP: 130/62   Pulse: 87   Temp: 98.8 F (37.1 C) 98.3 F (36.8 C)  Resp: 14     General: NAD Cardiovascular: RRR Respiratory: CTAB  Discharge Instructions   Discharge Instructions    Diet general    Complete by:  As directed      Discharge instructions    Complete by:  As directed   Follow up with Dr Roda Shutters, orthopedics in 2 weeks.     Increase activity slowly    Complete by:  As directed           Current Discharge Medication List    START taking these medications   Details  enoxaparin (LOVENOX)  30 MG/0.3ML injection Inject 0.3 mLs (30 mg total) into the skin daily. Qty: 0 Syringe    loratadine (CLARITIN) 10 MG tablet Take 1 tablet (10 mg total) by mouth daily.      CONTINUE these medications which have CHANGED   Details  HYDROcodone-acetaminophen (NORCO/VICODIN) 5-325 MG tablet Take 1-2 tablets by mouth every 6 (six) hours as needed for moderate  pain. Qty: 20 tablet, Refills: 0    LORazepam (ATIVAN) 2 MG/ML injection Inject 0.75 mLs (1.5 mg total) into the muscle See admin instructions. Inject 0.75 ml (1.5 mg) intramuscularly every 24 hours as needed for seizure activity Qty: 1 mL, Refills: 0      CONTINUE these medications which have NOT CHANGED   Details  acetaminophen (TYLENOL) 325 MG tablet Take 650 mg by mouth every 4 (four) hours as needed for fever (pain).    aspirin EC 81 MG tablet Take 81 mg by mouth daily.    bisacodyl (DULCOLAX) 5 MG EC tablet Take 10 mg by mouth at bedtime.    calcium carbonate (TUMS - DOSED IN MG ELEMENTAL CALCIUM) 500 MG chewable tablet Chew 1 tablet by mouth 3 (three) times daily with meals.    chlorhexidine (PERIDEX) 0.12 % solution Place 5-10 mLs onto teeth 2 (two) times daily. Dip toothbrush in 5-10 mls and liberally brush teeth twice daily    furosemide (LASIX) 20 MG tablet Take 20 mg by mouth daily.    !! levETIRAcetam (KEPPRA) 1000 MG tablet Take 1,000 mg by mouth 2 (two) times daily. Take with a 250 mg tablet for a 1250 mg dose.    !! levETIRAcetam (KEPPRA) 250 MG tablet Take 250 mg by mouth 2 (two) times daily. Take with a 1000 mg tablet for a 1250 mg dose    methocarbamol (ROBAXIN) 500 MG tablet Take 1 tablet (500 mg total) by mouth every 6 (six) hours as needed for muscle spasms. Qty: 15 tablet, Refills: 0    Multiple Vitamins-Minerals (CERTAVITE/ANTIOXIDANTS) TABS Take 1 tablet by mouth daily.    OXYGEN Inhale into the lungs as needed (to maintain SATS >90%).    polyethylene glycol (MIRALAX / GLYCOLAX) packet Take 17 g by mouth every other day. Mix in 4-8 oz of liquid and drink    rosuvastatin (CRESTOR) 5 MG tablet Take 5 mg by mouth every evening.    Vitamin D, Ergocalciferol, (DRISDOL) 50000 UNITS CAPS capsule Take 50,000 Units by mouth every 30 (thirty) days. On the 1st of each month     !! - Potential duplicate medications found. Please discuss with provider.      Allergies  Allergen Reactions  . Hydrochlorothiazide     Unknown, on paperwork from facility    Follow-up Information    Follow up with Cheral Almas, MD In 2 weeks.   Specialty:  Orthopedic Surgery   Why:  left hip fracture follow up   Contact information:   [address] [place] [address] [phone]       Please follow up.   Why:  f/u with MD at SNF       The results of significant diagnostics from this hospitalization (including imaging, microbiology, ancillary and laboratory) are listed below for reference.    Significant Diagnostic Studies: Dg Hip Unilat With Pelvis 2-3 Views Left  12/18/2015  CLINICAL DATA:  Patient fell out of wheelchair on the fifteenth. Mobile x-ray suggested left hip and pelvic fracture. EXAM: DG HIP (WITH OR WITHOUT PELVIS) 2-3V LEFT COMPARISON:  None.  FINDINGS: The limited study due to patient positioning. Diffuse bone demineralization. Apparent fracture through the left femoral neck with varus angulation of the fracture fragments. No dislocation at the hip joint. Pelvis appears intact. No gross displaced fracture identified. Due to patient rotation however visualization of the left pelvis is limited. Degenerative changes noted in the lower lumbar spine and hips. IMPRESSION: Fracture of the left femoral neck with varus angulation. Electronically Signed   By: Burman Nieves M.D.   On: 12/18/2015 02:01    Microbiology: No results found for this or any previous visit (from the past 240 hour(s)).   Labs: Basic Metabolic Panel:  Recent Labs Lab 12/18/15 0100  NA 141  K 4.2  CL 107  CO2 23  GLUCOSE 104*  BUN 15  CREATININE 0.78  CALCIUM 9.2   Liver Function Tests: No results for input(s): AST, ALT, ALKPHOS, BILITOT, PROT, ALBUMIN in the last 168 hours. No results for input(s): LIPASE, AMYLASE in the last 168 hours. No results for input(s): AMMONIA in the last 168 hours. CBC:  Recent Labs Lab 12/18/15 0100  WBC  8.9  NEUTROABS 5.8  HGB 11.4*  HCT 35.3*  MCV 86.5  PLT 133*   Cardiac Enzymes: No results for input(s): CKTOTAL, CKMB, CKMBINDEX, TROPONINI in the last 168 hours. BNP: BNP (last 3 results) No results for input(s): BNP in the last 8760 hours.  ProBNP (last 3 results) No results for input(s): PROBNP in the last 8760 hours.  CBG: No results for input(s): GLUCAP in the last 168 hours.     SignedRamiro Harvest MD.  [facility] 12/18/2015, 10:57 AM

## 2015-12-18 NOTE — ED Notes (Signed)
MD CHU at the bedside

## 2015-12-18 NOTE — ED Notes (Signed)
Pt being transported home by [facility] at this time

## 2015-12-18 NOTE — Progress Notes (Signed)
Patient is from [facility] in [place] [phone]). Inpatient CSW will continue to follow for placement needs and possible transfer back to [facility] when medically cleared for discharge.   Noe Gens, LCSW [facility] ED/ 2 [facility] Clinical Social Worker [phone]

## 2015-12-18 NOTE — ED Notes (Signed)
Received patient on stretcher from B pod, no speech at this time. NAD, does not appear to be in any pain.

## 2015-12-18 NOTE — ED Notes (Signed)
Patient was ordered a regular diet for lunch. 

## 2015-12-18 NOTE — Clinical Social Work Note (Addendum)
Clinical Social Work Assessment  Patient Details  Name: Jason Burgess MRN: [id number] Date of Birth: [birth year]  Date of referral:  12/18/15               Reason for consult:  Facility Placement                Permission sought to share information with:  Facility Medical sales representative, Family Supports Permission granted to share information::   (Patient has dementia; His brother Jason Burgess is caregiver)  Name::     Miloh Alcocer- Brother [id number])  Agency::  [facility]- [phone]  Relationship::     Contact Information:     Housing/Transportation Living arrangements for the past 2 months:  Skilled Nursing Facility Source of Information:  Facility, Other (Comment Required) (Sibling- Erasmo Leventhal) Patient Interpreter Needed:  None Criminal Activity/Legal Involvement Pertinent to Current Situation/Hospitalization:  No - Comment as needed Significant Relationships:  Siblings Lives with:  Facility Resident Do you feel safe going back to the place where you live?  Yes Need for family participation in patient care:  Yes (Comment)  Care giving concerns:  No care giving concerns identified at this time. Patient and Patient's brother are satisfied with care at [facility] at this time.    Social Worker assessment / plan:  Patient is a 70 YO African American male who presents to the ED with a hip fracture. CSW engaged with Patient's brother, Jason Burgess, via telephone as the patietn is unable to provide his own history due to dementia. CSW introduced self, role of CSW, and began discussion of discharge planning. Patient is a current resident at [facility] and per Jason Burgess will return at time of discharge. Jason Burgess reports that he is familiar with process from previous admissions. Patient will be followed by CSW on inpatient unit regarding transfer back to [facility].   Employment  status:  Retired, Disabled (Comment on whether or not currently receiving Disability) Insurance information:  Managed Medicare PT Recommendations:  Not assessed at this time Information / Referral to community resources:  Skilled Nursing Facility  Patient/Family's Response to care:  Brother is agreeable to transfer back to [facility] SNF at time of discharge  Patient/Family's Understanding of and Emotional Response to Diagnosis, Current Treatment, and Prognosis:  Patient's brother verbalized strong understanding of Patient's current diagnosis, treatment, and prognosis. He responds in a positive manner.   Emotional Assessment Appearance:  Appears stated age Attitude/Demeanor/Rapport:  Unable to Assess Affect (typically observed):  Unable to Assess Orientation:  Fluctuating Orientation (Suspected and/or reported Sundowners) Alcohol / Substance use:  Not Applicable Psych involvement (Current and /or in the community):  No (Comment)  Discharge Needs  Concerns to be addressed:  Care Coordination Readmission within the last 30 days:  No Current discharge risk:  None Barriers to Discharge:  Continued Medical Work up   [employer], LCSW 12/18/2015, 8:58 AM

## 2015-12-18 NOTE — Progress Notes (Signed)
Per Dr. Janee Morn, Patient was seen by [facility] and is being discharged- to follow up in 2 weeks. Patient to be discharged back to [facility]. CSW spoke with Tresa Endo at [facility] who confirmed that Patietn can return. CSW to facilitate transfer back to SNF. Patient's brother called and is agreeable to plans. CSW will continue to follow for disposition.   Noe Gens, LCSW [facility] ED/ 2 [facility] Clinical Social Worker [phone]

## 2015-12-18 NOTE — ED Notes (Signed)
MD Thompson at the bedside.  

## 2015-12-18 NOTE — H&P (Signed)
History and Physical  Patient Name: Jason Burgess     NGE:[id number]    DOB: January 22, 1946    DOA: 12/18/2015 Referring physician: Dereck Leep, MD PCP: Colon Branch, MD      Chief Complaint: Hip pain and fall  HPI: Jason Burgess is a 70 y.o. male with a past medical history significant for developmental delay, dementia, Parkinson's and epilepsy who presents with hip pain after a fall.  All history is collected from the chart and from EDP, as the patient is unable to provide his own history due to dementia.  The patient was in his normal state of health until yesterday when he fell out of his wheelchair.  Today, the patient appeared to staff to have continued pain, and so a radiograph was ordered that reportedly showed fracture, and the patient was transferred to the ED.    In the ED, the patient was afebrile and hemodynamically stable and a plain radiograph of the L hip showed a femoral neck fracture.  The case was discussed with Dr. Roda Shutters who agreed to see the patient, and TRH were asked to admit for medical management.    The patient has no active heart issues, angina, or history of MI. He is wheelchair bound and somewhat communicative at baseline.  He denies hip pain, fever, chest discomfort, dyspnea, cough, or abdominal pain, limited by patient mental status.    Review of Systems:  Appears to have L hip pain with movement.  Otherwise ROS is not possible to obtain because of patient dementia/disability.         Allergies  Allergen Reactions  . Hydrochlorothiazide     Unknown, on paperwork from facility     Prior to Admission medications   Medication Sig Start Date End Date Taking? Authorizing Provider  acetaminophen (TYLENOL) 325 MG tablet Take 650 mg by mouth every 4 (four) hours as needed for fever (pain).   Yes Historical Provider, MD  aspirin EC 81 MG tablet Take 81 mg by mouth daily.   Yes Historical Provider, MD  bisacodyl (DULCOLAX) 5 MG EC tablet Take 10 mg by  mouth at bedtime.   Yes Historical Provider, MD  calcium carbonate (TUMS - DOSED IN MG ELEMENTAL CALCIUM) 500 MG chewable tablet Chew 1 tablet by mouth 3 (three) times daily with meals.   Yes Historical Provider, MD  chlorhexidine (PERIDEX) 0.12 % solution Place 5-10 mLs onto teeth 2 (two) times daily. Dip toothbrush in 5-10 mls and liberally brush teeth twice daily   Yes Historical Provider, MD  furosemide (LASIX) 20 MG tablet Take 20 mg by mouth daily.   Yes Historical Provider, MD  HYDROcodone-acetaminophen (NORCO/VICODIN) 5-325 MG tablet Take 1-2 tablets by mouth every 6 (six) hours as needed for moderate pain. 09/07/15  Yes Joseph Art, DO  levETIRAcetam (KEPPRA) 1000 MG tablet Take 1,000 mg by mouth 2 (two) times daily. Take with a 250 mg tablet for a 1250 mg dose.   Yes Historical Provider, MD  levETIRAcetam (KEPPRA) 250 MG tablet Take 250 mg by mouth 2 (two) times daily. Take with a 1000 mg tablet for a 1250 mg dose   Yes Historical Provider, MD  LORazepam (ATIVAN) 2 MG/ML injection Inject 1.5 mg into the muscle See admin instructions. Inject 0.75 ml (1.5 mg) intramuscularly every 24 hours as needed for seizure activity   Yes Historical Provider, MD  methocarbamol (ROBAXIN) 500 MG tablet Take 1 tablet (500 mg total) by mouth every 6 (six) hours as needed for muscle  spasms. 09/07/15  Yes Joseph Art, DO  Multiple Vitamins-Minerals (CERTAVITE/ANTIOXIDANTS) TABS Take 1 tablet by mouth daily.   Yes Historical Provider, MD  OXYGEN Inhale into the lungs as needed (to maintain SATS >90%).   Yes Historical Provider, MD  polyethylene glycol (MIRALAX / GLYCOLAX) packet Take 17 g by mouth every other day. Mix in 4-8 oz of liquid and drink   Yes Historical Provider, MD  rosuvastatin (CRESTOR) 5 MG tablet Take 5 mg by mouth every evening.   Yes Historical Provider, MD  Vitamin D, Ergocalciferol, (DRISDOL) 50000 UNITS CAPS capsule Take 50,000 Units by mouth every 30 (thirty) days. On the 1st of each month    Yes Historical Provider, MD    Past Medical History  Diagnosis Date  . Epilepsy (HCC)   . SIADH (syndrome of inappropriate ADH production) (HCC)   . Parkinson disease (HCC)   . Arthritis   . Dementia without behavioral disturbance   . Dysarthria and anarthria   . Thrombocytopenia (HCC)   . Muscle weakness (generalized)   . Unspecified intellectual disabilities   . Hypertension   . PVD (peripheral vascular disease) (HCC)   . Lack of coordination     History reviewed. No pertinent past surgical history.  Family history: family history is not on file.  Patient is unable to state due to dementia.  Social History: Patient lives in [place].  He is wheelchair bound at baseline.  He is probably able to make his needs known at baseline.         Physical Exam: BP 139/64 mmHg  Pulse 73  Temp(Src) 99.2 F (37.3 C) (Oral)  Resp 16  SpO2 96% General appearance: Emaciated adult male, awake and in no obviuos distress.   Eyes: Anicteric, conjunctiva pink, lids and lashes normal.     ENT: No nasal deformity, discharge, or epistaxis.  OP moist without lesions.  Poor dentition. Skin: Warm and dry.  Cardiac: RRR, nl S1-S2, 2/6 systolic murmur appreciated.  Capillary refill is brisk.  No JVD.  No LE edema.  Radial pulses 2+ and symmetric. Respiratory: Normal respiratory rate and rhythm.  CTAB without rales or wheezes. Abdomen: Abdomen soft without rigidity.  No TTP. No ascites, distension.   MSK: Diffuse muscle wasting.  No pain with palpation of left hip, no obvious deformity to my exam. Neuro: Pleasant, smiling.  Answers some questions.  When asked "do you use a wheelchair or not?" responds "sometimes".  Speech is brief but not particularly slurred.  Contractures everywhere.    Psych:  Affect pleasant. Remainder of exam is limited.      Labs on Admission:  The metabolic panel shows normal sodium, potassium, bicarbonate, and renal function. The complete blood count shows no  leukocytosis. Chronic stable normocytic anemia and thrombocytopenia.   Radiological Exams on Admission: Personally reviewed: Dg Hip Unilat With Pelvis 2-3 Views Left 12/18/2015  CLINICAL DATA:  Patient fell out of wheelchair on the fifteenth. Mobile x-ray suggested left hip and pelvic fracture. EXAM: DG HIP (WITH OR WITHOUT PELVIS) 2-3V LEFT COMPARISON:  None. FINDINGS: The limited study due to patient positioning. Diffuse bone demineralization. Apparent fracture through the left femoral neck with varus angulation of the fracture fragments. No dislocation at the hip joint. Pelvis appears intact. No gross displaced fracture identified. Due to patient rotation however visualization of the left pelvis is limited. Degenerative changes noted in the lower lumbar spine and hips. IMPRESSION: Fracture of the left femoral neck with varus angulation. Electronically  Signed   By: Burman Nieves M.D.   On: 12/18/2015 02:01    EKG: Independently reviewed. Rate 87, sinus rhythm. No ST or T-wave changes. QTC 437.    Assessment and Plan: 1. Hip fracture: The patient will be seen by Dr. Roda Shutters in the morning at [facility], to evaluate for likely non-operative management of the L hip.   -Admit to med-surg bed -Hydrocodone-acetaminophen or morphine as tolerated for pain -Bed rest, apply ice, document sedation and vitals per Hip fracture protocol   Overall, the patient would be at intermediate risk for the potential surgery due to age and debility.   Cardiac: Patient has a RCRI score of 0 (active cardiac condition, CHF, CAD, DM treated with insulin, TIA/CVA, Cr > 2.0). The patient has no active cardiac symptoms, limited by ability to provide history, but his functional capacity is poor. -No further testing recommended if planning to proceed  Pulmonary: Patient does not have diagnosed COPD or OSA. Patient would be at average risk for pulmonary complications.   Endocrine: Patient has no history of steroid use or  DM.  DVT ppx: Patient is at average risk for DVTs. -Defer to surgeon's usual protocol.  Meds: Hold none, unless surgery, then hold furosemide  2. Anemia: Unspecified type.  Normocytic.  Likely from chronic disease. -Check iron studies    DVT PPx: SCDs Diet: Regular for now Consultants: Orthopedics Code Status: FULL Family Communication: None present  Medical decision making: What exists of the patient's previous chart was reviewed in depth and the case was discussed with Dr. Mora Bellman. Patient seen 1:44 AM on 12/18/2015.  Disposition Plan:  Admit to [facility] for hip fracture.  Consult with Ortho tomorrow.    Alberteen Sam [facility] Pager [phone]

## 2015-12-18 NOTE — ED Notes (Signed)
Pt moved to [facility]

## 2015-12-18 NOTE — ED Notes (Signed)
EMS called to [facility] in [place] to transport pt after it was discovered via a mobile X-ray that he had a "left hip & pelvis fracture".  According to staff pt fell out of his wheelchair on the 15th (time unknown) but no reported LOC or head injury.  Pt has a hx of dementia, dysaphia and parkinson's and only displays pain when moving.  He usually gets around the facility by wheelchair.

## 2015-12-18 NOTE — Consult Note (Signed)
ORTHOPAEDIC CONSULTATION  REQUESTING PHYSICIAN: Rodolph Bong, MD  Chief Complaint: Left hip fracture  HPI: Jason Burgess is a 70 y.o. male who presents with left hip fracture s/p mechanical fall from wheelchair.  HPI is limited due to patient being nonverbal and dementia.  Patient is approximately 10 weeks s/o right supracondylar femur fx.  Ortho consulted for new left hip fx.   Past Medical History  Diagnosis Date  . Epilepsy (HCC)   . SIADH (syndrome of inappropriate ADH production) (HCC)   . Parkinson disease (HCC)   . Arthritis   . Dementia without behavioral disturbance   . Dysarthria and anarthria   . Thrombocytopenia (HCC)   . Muscle weakness (generalized)   . Unspecified intellectual disabilities   . Hypertension   . PVD (peripheral vascular disease) (HCC)   . Lack of coordination    History reviewed. No pertinent past surgical history. Social History   Social History  . Marital Status: Single    Spouse Name: [name]  . Number of Children: [name]  . Years of Education: [name]   Social History Main Topics  . Smoking status: Never Smoker   . Smokeless tobacco: None  . Alcohol Use: No  . Drug Use: No  . Sexual Activity: Not Asked   Other Topics Concern  . None   Social History Narrative   History reviewed. No pertinent family history. Allergies  Allergen Reactions  . Hydrochlorothiazide     Unknown, on paperwork from facility    Prior to Admission medications   Medication Sig Start Date End Date Taking? Authorizing Provider  acetaminophen (TYLENOL) 325 MG tablet Take 650 mg by mouth every 4 (four) hours as needed for fever (pain).   Yes Historical Provider, MD  aspirin EC 81 MG tablet Take 81 mg by mouth daily.   Yes Historical Provider, MD  bisacodyl (DULCOLAX) 5 MG EC tablet Take 10 mg by mouth at bedtime.   Yes Historical Provider, MD  calcium carbonate (TUMS - DOSED IN MG ELEMENTAL CALCIUM) 500 MG chewable tablet Chew 1 tablet by mouth 3 (three)  times daily with meals.   Yes Historical Provider, MD  chlorhexidine (PERIDEX) 0.12 % solution Place 5-10 mLs onto teeth 2 (two) times daily. Dip toothbrush in 5-10 mls and liberally brush teeth twice daily   Yes Historical Provider, MD  furosemide (LASIX) 20 MG tablet Take 20 mg by mouth daily.   Yes Historical Provider, MD  HYDROcodone-acetaminophen (NORCO/VICODIN) 5-325 MG tablet Take 1-2 tablets by mouth every 6 (six) hours as needed for moderate pain. 09/07/15  Yes Joseph Art, DO  levETIRAcetam (KEPPRA) 1000 MG tablet Take 1,000 mg by mouth 2 (two) times daily. Take with a 250 mg tablet for a 1250 mg dose.   Yes Historical Provider, MD  levETIRAcetam (KEPPRA) 250 MG tablet Take 250 mg by mouth 2 (two) times daily. Take with a 1000 mg tablet for a 1250 mg dose   Yes Historical Provider, MD  LORazepam (ATIVAN) 2 MG/ML injection Inject 1.5 mg into the muscle See admin instructions. Inject 0.75 ml (1.5 mg) intramuscularly every 24 hours as needed for seizure activity   Yes Historical Provider, MD  methocarbamol (ROBAXIN) 500 MG tablet Take 1 tablet (500 mg total) by mouth every 6 (six) hours as needed for muscle spasms. 09/07/15  Yes Joseph Art, DO  Multiple Vitamins-Minerals (CERTAVITE/ANTIOXIDANTS) TABS Take 1 tablet by mouth daily.   Yes Historical Provider, MD  OXYGEN Inhale into the lungs as  needed (to maintain SATS >90%).   Yes Historical Provider, MD  polyethylene glycol (MIRALAX / GLYCOLAX) packet Take 17 g by mouth every other day. Mix in 4-8 oz of liquid and drink   Yes Historical Provider, MD  rosuvastatin (CRESTOR) 5 MG tablet Take 5 mg by mouth every evening.   Yes Historical Provider, MD  Vitamin D, Ergocalciferol, (DRISDOL) 50000 UNITS CAPS capsule Take 50,000 Units by mouth every 30 (thirty) days. On the 1st of each month   Yes Historical Provider, MD   Dg Hip Unilat With Pelvis 2-3 Views Left  12/18/2015  CLINICAL DATA:  Patient fell out of wheelchair on the fifteenth. Mobile  x-ray suggested left hip and pelvic fracture. EXAM: DG HIP (WITH OR WITHOUT PELVIS) 2-3V LEFT COMPARISON:  None. FINDINGS: The limited study due to patient positioning. Diffuse bone demineralization. Apparent fracture through the left femoral neck with varus angulation of the fracture fragments. No dislocation at the hip joint. Pelvis appears intact. No gross displaced fracture identified. Due to patient rotation however visualization of the left pelvis is limited. Degenerative changes noted in the lower lumbar spine and hips. IMPRESSION: Fracture of the left femoral neck with varus angulation. Electronically Signed   By: Burman Nieves M.D.   On: 12/18/2015 02:01    All pertinent xrays, MRI, CT independently reviewed and interpreted  Positive ROS: All other systems have been reviewed and were otherwise negative with the exception of those mentioned in the HPI and as above.  Physical Exam: General: Nonverbal, NAD Cardiovascular: No pedal edema Respiratory: No cyanosis, no use of accessory musculature GI: No organomegaly, abdomen is soft and non-tender Skin: No lesions in the area of chief complaint Neurologic: Sensation intact distally Psychiatric: Patient is demented Lymphatic: No axillary or cervical lymphadenopathy  MUSCULOSKELETAL:  - chronic hip flexion and adduction contractures - skin intact - NVI distally - compartments soft  Assessment: Left femoral neck hip fracture  Plan: - patient is nonverbal, nonambulatory at baseline with chronic contractures - will plan to treat nonop - prefer lovenox - may dc back to SNF from ortho stand point - f/u in office in 2 weeks  Thank you for the consult and the opportunity to see [name], MD [facility] [phone] 10:34 AM

## 2016-04-22 IMAGING — DX DG KNEE 1-2V*R*
2 series · 2 of 2 positions shown · non-contrast
Comparison: None.

CLINICAL DATA: Fall.  Right knee pain.

EXAM:
RIGHT KNEE - 1-2 VIEW

[knee ap]
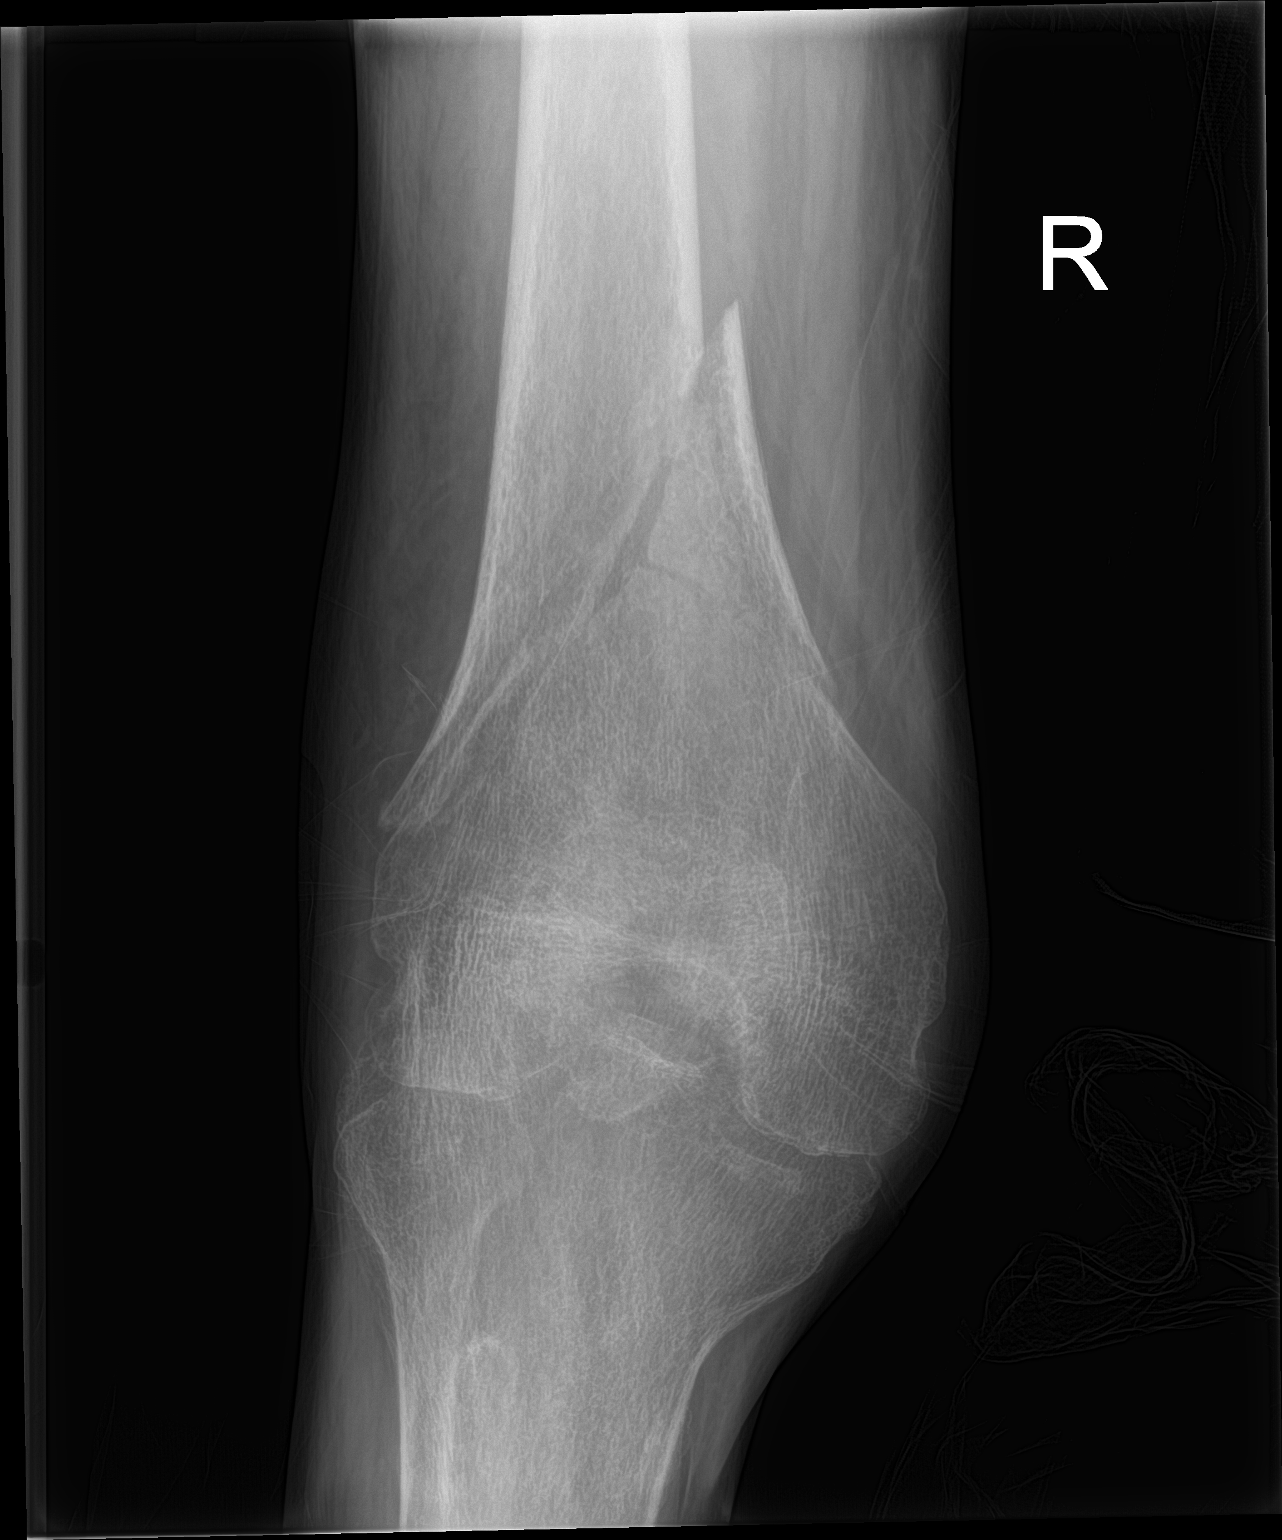

[knee lat]
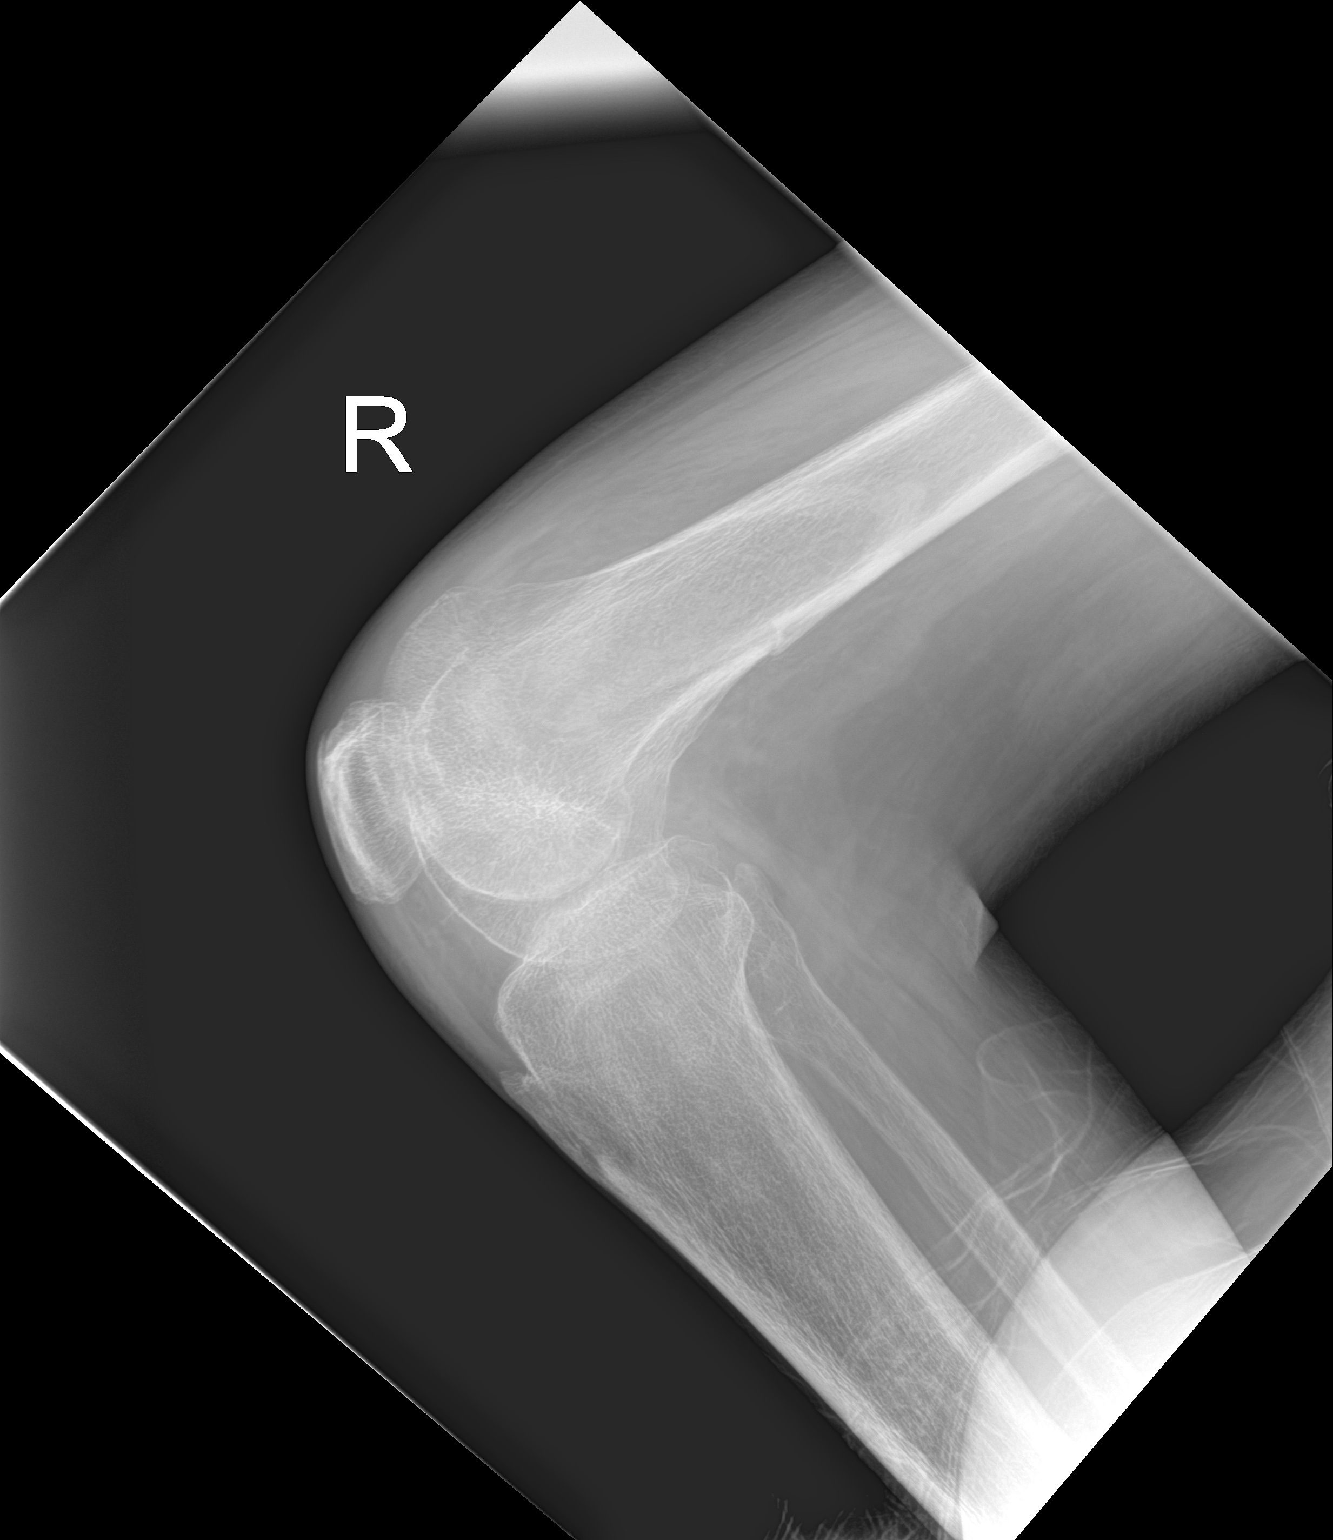

[2 of 2 positions shown; findings below may reference images not displayed]

FINDINGS: There is a comminuted fracture of the distal metaphysis of the right
femur, likely non articular, with 14 mm overriding of the fracture
fragments and a 1 cm medial displacement of the dominant distal
fracture fragment. There is prominent soft tissue swelling
surrounding the fracture site. No malalignment is seen at the right
knee joint. No definite right knee joint effusion. No suspicious
focal osseous lesion in the right knee. Diffuse osteopenia.
IMPRESSION: Comminuted, overriding, displaced fracture of the distal metaphysis
of the right femur, likely non articular.

Diffuse osteopenia.

## 2016-08-04 IMAGING — DX DG HIP (WITH OR WITHOUT PELVIS) 2-3V*L*
3 series · 3 of 3 positions shown · non-contrast
Comparison: None.

CLINICAL DATA: Patient fell out of wheelchair on the fifteenth.
Mobile x-ray suggested left hip and pelvic fracture.

EXAM:
DG HIP (WITH OR WITHOUT PELVIS) 2-3V LEFT

[pelvis ap]
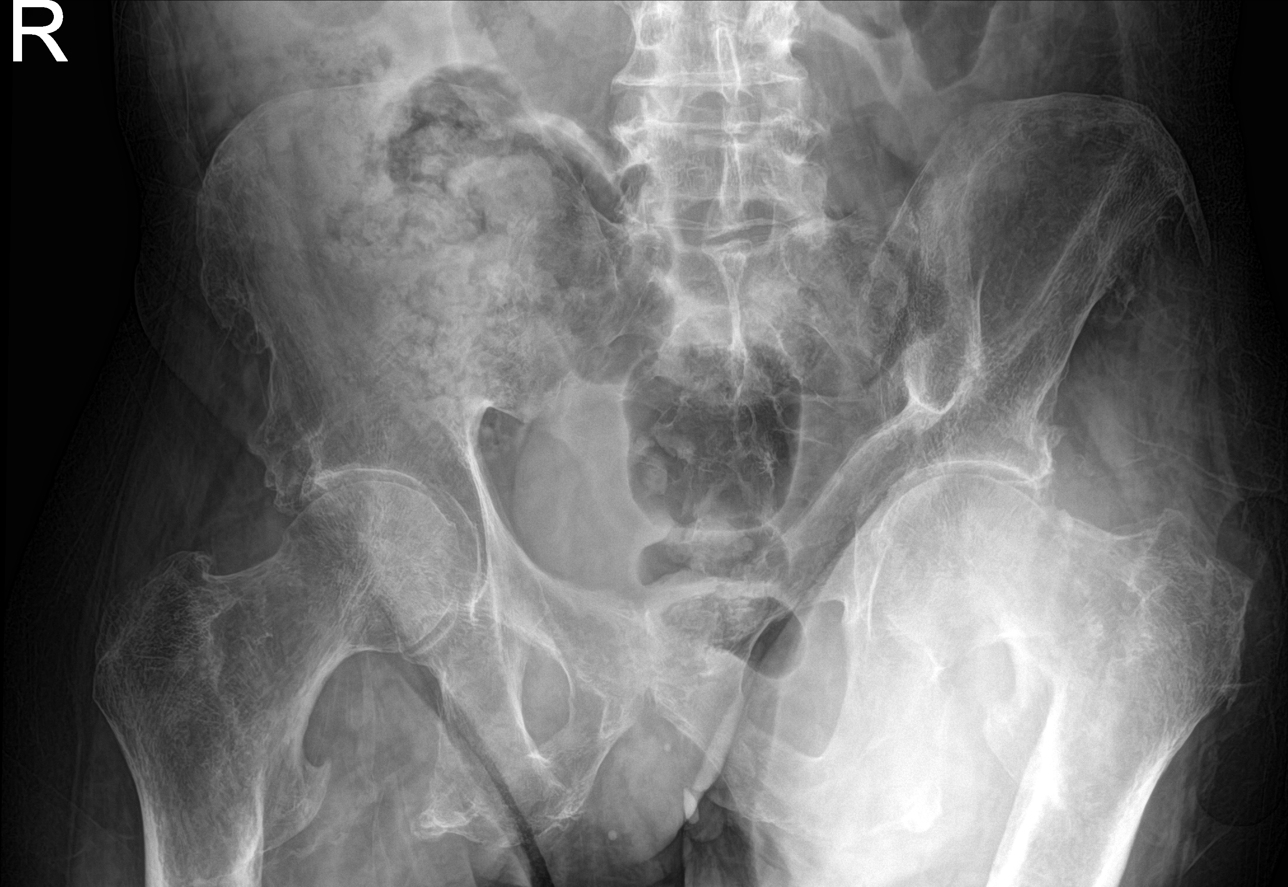

[hip ap]
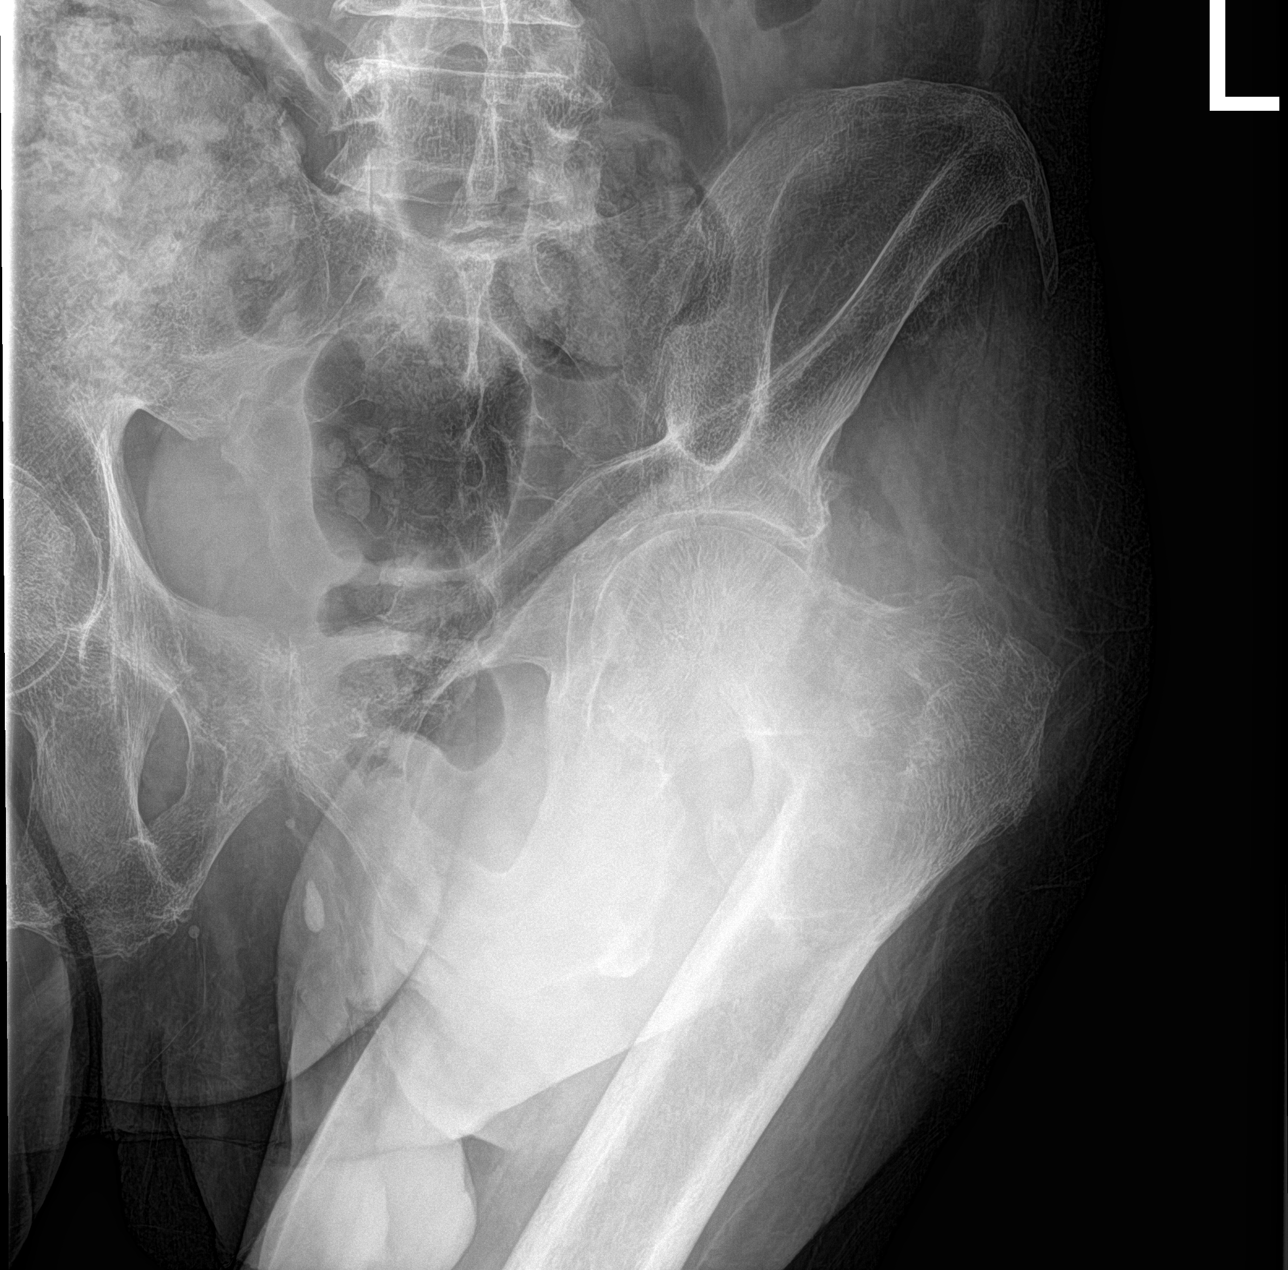

[hip x-table]
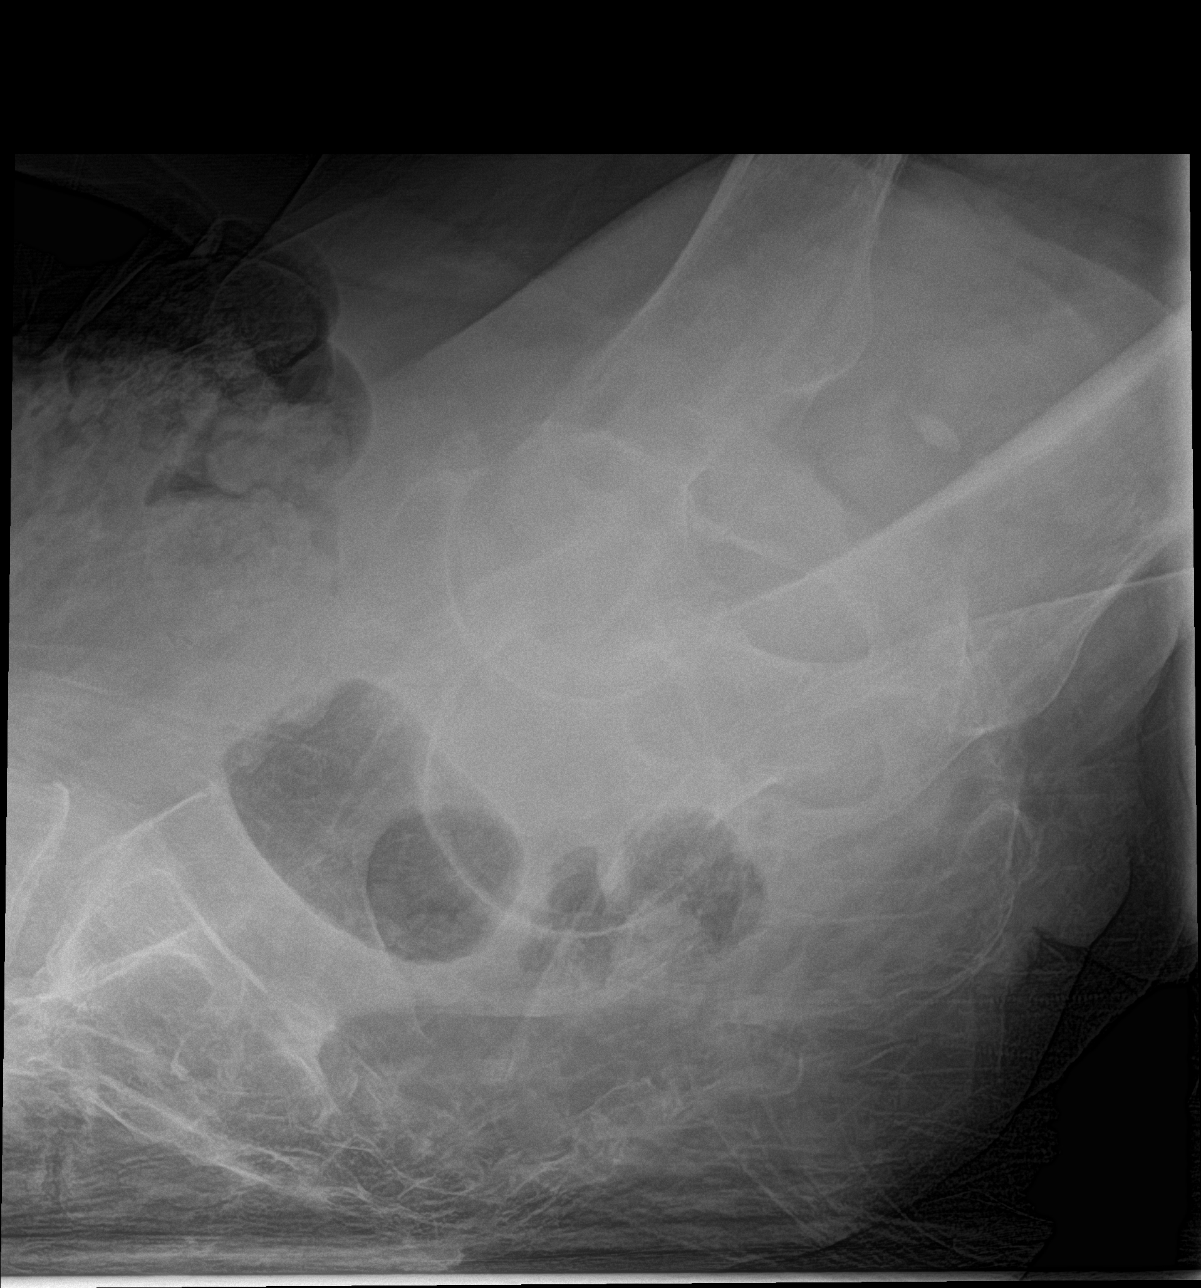

[3 of 3 positions shown; findings below may reference images not displayed]

FINDINGS: The limited study due to patient positioning. Diffuse bone
demineralization. Apparent fracture through the left femoral neck
with varus angulation of the fracture fragments. No dislocation at
the hip joint. Pelvis appears intact. No gross displaced fracture
identified. Due to patient rotation however visualization of the
left pelvis is limited. Degenerative changes noted in the lower
lumbar spine and hips.
IMPRESSION: Fracture of the left femoral neck with varus angulation.

## 2021-07-01 DEATH — deceased
